# Patient Record
Sex: Male | Born: 1960
Health system: Southern US, Community
[De-identification: ages and names within clinical notes are randomized; demographics above are authoritative.]

## PROBLEM LIST (undated history)

## (undated) DIAGNOSIS — I499 Cardiac arrhythmia, unspecified: Secondary | ICD-10-CM

## (undated) DIAGNOSIS — Z9889 Other specified postprocedural states: Secondary | ICD-10-CM

## (undated) DIAGNOSIS — R112 Nausea with vomiting, unspecified: Secondary | ICD-10-CM

## (undated) DIAGNOSIS — T7840XA Allergy, unspecified, initial encounter: Secondary | ICD-10-CM

## (undated) DIAGNOSIS — K5790 Diverticulosis of intestine, part unspecified, without perforation or abscess without bleeding: Secondary | ICD-10-CM

## (undated) DIAGNOSIS — L209 Atopic dermatitis, unspecified: Secondary | ICD-10-CM

## (undated) DIAGNOSIS — Z860100 Personal history of colon polyps, unspecified: Secondary | ICD-10-CM

## (undated) DIAGNOSIS — E785 Hyperlipidemia, unspecified: Secondary | ICD-10-CM

## (undated) DIAGNOSIS — D649 Anemia, unspecified: Secondary | ICD-10-CM

## (undated) DIAGNOSIS — K648 Other hemorrhoids: Principal | ICD-10-CM

## (undated) DIAGNOSIS — F419 Anxiety disorder, unspecified: Secondary | ICD-10-CM

## (undated) HISTORY — PX: POLYPECTOMY: SHX149

## (undated) HISTORY — DX: Hyperlipidemia, unspecified: E78.5

## (undated) HISTORY — PX: COLONOSCOPY: SHX174

## (undated) HISTORY — PX: FRACTURE SURGERY: SHX138

## (undated) HISTORY — DX: Allergy, unspecified, initial encounter: T78.40XA

## (undated) HISTORY — DX: Atopic dermatitis, unspecified: L20.9

## (undated) HISTORY — DX: Other hemorrhoids: K64.8

## (undated) HISTORY — DX: Anemia, unspecified: D64.9

## (undated) HISTORY — DX: Cardiac arrhythmia, unspecified: I49.9

## (undated) HISTORY — DX: Anxiety disorder, unspecified: F41.9

## (undated) HISTORY — PX: VASECTOMY: SHX75

## (undated) HISTORY — PX: OTHER SURGICAL HISTORY: SHX169

## (undated) HISTORY — PX: WISDOM TOOTH EXTRACTION: SHX21

---

## 1997-11-03 DIAGNOSIS — I499 Cardiac arrhythmia, unspecified: Secondary | ICD-10-CM

## 1997-11-03 HISTORY — DX: Cardiac arrhythmia, unspecified: I49.9

## 1998-04-18 ENCOUNTER — Emergency Department (HOSPITAL_COMMUNITY): Admission: EM | Admit: 1998-04-18 | Discharge: 1998-04-18 | Payer: Self-pay | Admitting: Emergency Medicine

## 2001-07-15 ENCOUNTER — Ambulatory Visit (HOSPITAL_BASED_OUTPATIENT_CLINIC_OR_DEPARTMENT_OTHER): Admission: RE | Admit: 2001-07-15 | Discharge: 2001-07-15 | Payer: Self-pay | Admitting: Orthopedic Surgery

## 2004-01-08 ENCOUNTER — Encounter: Admission: RE | Admit: 2004-01-08 | Discharge: 2004-01-08 | Payer: Self-pay | Admitting: Family Medicine

## 2004-03-22 ENCOUNTER — Encounter: Admission: RE | Admit: 2004-03-22 | Discharge: 2004-03-22 | Payer: Self-pay | Admitting: Family Medicine

## 2006-05-07 ENCOUNTER — Ambulatory Visit: Payer: Self-pay | Admitting: Internal Medicine

## 2010-11-03 HISTORY — PX: KNEE ARTHROSCOPY: SUR90

## 2010-11-13 ENCOUNTER — Ambulatory Visit: Admit: 2010-11-13 | Payer: Self-pay

## 2011-01-15 ENCOUNTER — Ambulatory Visit (INDEPENDENT_AMBULATORY_CARE_PROVIDER_SITE_OTHER): Payer: Commercial Managed Care - PPO | Admitting: Sports Medicine

## 2011-01-15 ENCOUNTER — Encounter: Payer: Self-pay | Admitting: Sports Medicine

## 2011-01-15 DIAGNOSIS — M25569 Pain in unspecified knee: Secondary | ICD-10-CM

## 2011-01-15 DIAGNOSIS — IMO0002 Reserved for concepts with insufficient information to code with codable children: Secondary | ICD-10-CM | POA: Insufficient documentation

## 2011-01-21 NOTE — Assessment & Plan Note (Signed)
Summary: NP,RT KNEE PAIN,MC   Vital Signs:  Patient profile:   50 year old male Height:      70 inches Weight:      180 pounds BMI:     25.92 Pulse rate:   89 / minute BP sitting:   138 / 96  (left arm)  Vitals Entered By: Rochele Pages RN (January 15, 2011 10:29 AM) CC: rain medial and lateral rt knee since 09/2010   CC:  rain medial and lateral rt knee since 09/2010.  History of Present Illness: 50 yo M new patient here for eval of Rt knee pain.  Was running on treadmill 4 months ago in 11/11 and felt tweak in knee, no specific twist or fall.  Since then has had persistent dull ache in medial aspect of knee.  Does get 1-2 weekly episodes of locking which resolves fairly quickly.  No overt swelling.  Uneven surfaces tougher.  Does not feel comfortable running, only doing bike and elliptical. No prior knee problems. Has been doing some home exercises. Minimal med use, occasional ibuprofen. Recent accupuncture. Prior big runner, 20-25 mpw. Not tried knee brace.  Preventive Screening-Counseling & Management  Alcohol-Tobacco     Smoking Status: never  Allergies (verified): 1)  ! 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   Eyes:  vision grossly intact.   Neck:  supple.   Lungs:  normal respiratory effort.   Abdomen:  soft.   Msk:  Knee: Normal to inspection with no erythema or effusion or obvious bony abnormalities. Palpation with no warmth, + medial joint line tenderness with extruded meniscus palpable, no patellar tenderness or condyle tenderness. ROM normal in flexion and  extension and lower leg rotation. Ligaments with solid consistent endpoints including ACL, PCL, LCL, MCL. Grossly + medial Mcmurray's and + provocative meniscal tests. Non painful patellar compression. Patellar and quadriceps tendons unremarkable. Hamstring and quadriceps strength is normal.  MSK [place] Rt knee: PT and QT intact, no effusion. + MMT with some calcification, mild doppler flow. Lat meniscus intact.  Neurologic:  alert & oriented X3.     Impression & Recommendations:  Problem # 1:  KNEE PAIN, RIGHT (ICD-719.46)  This appears to be classic MMT - see below  Orders: [place] LIMITED (11914)  Problem # 2:  MEDIAL MENISCUS TEAR (ICD-836.0)  Has failed 4 months of conservative therapy with meds, rehab exercises, and refraining from running.  In addition, he continues to have mechanical symptoms consistent with catching/locking - discussed options, given duration and degree of mech symptoms, will refer to [facility] or [facility]) for arthroscopy eval  Orders: [place] LIMITED ([id number])  Patient Instructions: 1)  You have an appt on Friday the 16th of March at 10:45am with Dr Eulah Pont at [facility]. Address is [address] [place]. [phone]   Orders Added: 1)  New Patient Level III [99203] 2)  [place] LIMITED [[id number]]  Appended Document: NP,RT KNEE PAIN,MC New appt is with Dr Marcene Corning on Wednesday March 21st at 9:30am. [address] [phone]

## 2011-02-17 ENCOUNTER — Encounter (HOSPITAL_BASED_OUTPATIENT_CLINIC_OR_DEPARTMENT_OTHER)
Admission: RE | Admit: 2011-02-17 | Discharge: 2011-02-17 | Disposition: A | Discharge status: Home / Self Care | Payer: Commercial Managed Care - PPO | Source: Ambulatory Visit | Attending: Orthopaedic Surgery | Admitting: Orthopaedic Surgery

## 2011-02-18 ENCOUNTER — Ambulatory Visit (HOSPITAL_BASED_OUTPATIENT_CLINIC_OR_DEPARTMENT_OTHER)
Admission: RE | Admit: 2011-02-18 | Discharge: 2011-02-18 | Disposition: A | Discharge status: Home / Self Care | Payer: Commercial Managed Care - PPO | Source: Ambulatory Visit | Attending: Orthopaedic Surgery | Admitting: Orthopaedic Surgery

## 2011-02-18 DIAGNOSIS — F3289 Other specified depressive episodes: Secondary | ICD-10-CM | POA: Insufficient documentation

## 2011-02-18 DIAGNOSIS — F329 Major depressive disorder, single episode, unspecified: Secondary | ICD-10-CM | POA: Insufficient documentation

## 2011-02-18 DIAGNOSIS — Z01810 Encounter for preprocedural cardiovascular examination: Secondary | ICD-10-CM | POA: Insufficient documentation

## 2011-02-18 DIAGNOSIS — M2240 Chondromalacia patellae, unspecified knee: Secondary | ICD-10-CM | POA: Insufficient documentation

## 2011-02-18 DIAGNOSIS — M23329 Other meniscus derangements, posterior horn of medial meniscus, unspecified knee: Secondary | ICD-10-CM | POA: Insufficient documentation

## 2011-03-04 NOTE — Op Note (Signed)
  NAMEANGELOS, Timothy Hoffman                 ACCOUNT NO.:  [id number]  MEDICAL RECORD NO.:  [id number]            PATIENT TYPE:  LOCATION:                                 FACILITY:  PHYSICIAN:  Lubertha Basque. Jerl Santos, M.D.     DATE OF BIRTH:  DATE OF PROCEDURE:  02/18/2011 DATE OF DISCHARGE:                              OPERATIVE REPORT   PREOPERATIVE DIAGNOSES: 1. Right knee torn medial meniscus. 2. Right knee chondromalacia patella.  POSTOPERATIVE DIAGNOSES: 1. Right knee torn medial meniscus. 2. Right knee chondromalacia patella.  PROCEDURES: 1. Right knee partial meniscectomy. 2. Right knee abrasion chondroplasty patellofemoral.  ANESTHESIA:  General and block.  ATTENDING SURGEON:  Lubertha Basque. Jerl Santos, MD  ASSISTANT:  Lindwood Qua, PA   INDICATIONS FOR PROCEDURE:  The patient is a 50 year old man with many months of right knee pain and swelling.  This has persisted despite bracing and oral antiinflammatories and relative rest.  By ultrasound study, he has a medial meniscus tear.  At this point, he is offered an arthroscopy.  Informed operative consent was obtained after discussion of possible complications including reaction to anesthesia and infection.  SUMMARY, FINDINGS, AND PROCEDURE:  Under general anesthesia and a bit of a block, a right knee arthroscopy was performed.  Suprapatellar pouch was benign while the patellofemoral joint exhibited focal breakdown of the apex of patella, addressed with chondroplasty and abrasion of bleeding bone and one tiny area.  Medial compartment exhibited a displaceable radial tear of the posterior horn of the medial meniscus tear.  This also had a horizontal component.  This was all in the posterior horn and about 10% partial medial meniscectomy was done. There were no degenerative changes in this compartment.  Lateral compartment was benign and ACL was intact.  DESCRIPTION OF PROCEDURE:  The patient was taken to the operating  suite where a general anesthetic was applied without difficulty.  He was also given a block in the preanesthesia area.  He was positioned supine and prepped and draped in a normal sterile fashion.  After administration of IV Kefzol, an arthroscopy of the right knee was performed through a total of 2 portals.  Findings were as noted above and procedure consisted of predominantly the partial medial meniscectomy done with basket and shaver removing about 10% of the medial meniscus all on the posterior horn.  I also performed the abrasion chondroplasty patellofemoral.  The knee was thoroughly irrigated followed by placement of Adaptic over the portals, dry gauze, and loose Ace wrap.  Estimated blood loss and intraoperative fluids can be obtained from anesthesia records.  DISPOSITION:  The patient was extubated in the operating room and taken to the recovery room in stable addition.  He is to go home same day and follow up in my office closely.  I will contact him by phone tonight.     Lubertha Basque Jerl Santos, M.D.     PGD/MEDQ  D:  02/18/2011  T:  02/19/2011  Job:  [id number]  Electronically Signed by Marcene Corning M.D. on 03/04/2011 02:22:32 PM

## 2011-08-07 ENCOUNTER — Encounter: Payer: Self-pay | Admitting: Internal Medicine

## 2011-09-24 ENCOUNTER — Ambulatory Visit (INDEPENDENT_AMBULATORY_CARE_PROVIDER_SITE_OTHER): Payer: 59 | Admitting: Internal Medicine

## 2011-09-24 ENCOUNTER — Encounter: Payer: Self-pay | Admitting: Internal Medicine

## 2011-09-24 VITALS — BP 122/88 | HR 60 | Ht 70.0 in | Wt 190.2 lb

## 2011-09-24 DIAGNOSIS — K625 Hemorrhage of anus and rectum: Secondary | ICD-10-CM

## 2011-09-24 DIAGNOSIS — Z1211 Encounter for screening for malignant neoplasm of colon: Secondary | ICD-10-CM

## 2011-09-24 NOTE — Patient Instructions (Signed)
You have been scheduled for a colonoscopy. Please follow written instructions given to you at your visit today.  Please pick up your prep kit at the pharmacy within the next 2-3 days. 

## 2011-09-24 NOTE — Progress Notes (Signed)
HISTORY OF PRESENT ILLNESS:  Timothy Hoffman is a 50 y.o. male who presents today regarding minor intermittent rectal bleeding and screening colonoscopy. Patient reports a 6 month history of intermittent minor rectal bleeding. Mostly blood on the issue and toilet bowl. No associated rectal pain. He denies change in bowel habits, abdominal pain, or weight loss. No family history of colon polyps or colon cancer. No prior history of GI problems or GI evaluations. Review of outside records from urgent care, [place], from February 2012 shows Hemoccult-positive stool on rectal exam. Normal hemoglobin of 14.8. Normal comprehensive metabolic panel, TSH, and PSA.  REVIEW OF SYSTEMS:  All non-GI ROS negative except for visual change requiring corrective lenses  Past Medical History  Diagnosis Date  . Arrhythmia 1999  . Hyperlipemia     Past Surgical History  Procedure Date  . Right shoulder scope   . Knee arthroscopy 2012    Social History Timothy Hoffman  reports that he has never smoked. He has never used smokeless tobacco. He reports that he drinks alcohol. He reports that he does not use illicit drugs.  family history includes Lymphoma in his father.  Allergies  Allergen Reactions  . Tequin Rash       PHYSICAL EXAMINATION: Vital signs: BP 122/88  Pulse 60  Ht 5\' 10"  (1.778 m)  Wt 190 lb 3.2 oz (86.274 kg)  BMI 27.29 kg/m2  Constitutional: generally well-appearing, no acute distress Psychiatric: alert and oriented x3, cooperative Eyes: extraocular movements intact, anicteric, conjunctiva pink Mouth: oral pharynx moist, no lesions Neck: supple no lymphadenopathy Cardiovascular: heart regular rate and rhythm, no murmur Lungs: clear to auscultation bilaterally Abdomen: soft, nontender, nondistended, no obvious ascites, no peritoneal signs, normal bowel sounds, no organomegaly Rectal: Deferred until colonoscopy Extremities: no lower extremity edema bilaterally Skin: no lesions on  visible extremities Neuro: No focal deficits.   ASSESSMENT:  #1. Minor intermittent rectal bleeding likely due to benign anorectal pathology #2. Screening colonoscopy. Appropriate candidate without contraindication.The nature of the procedure, as well as the risks, benefits, and alternatives were carefully and thoroughly reviewed with the patient. Ample time for discussion and questions allowed. The patient understood, was satisfied, and agreed to proceed. Movi prep prescribed. The patient instructed on use

## 2011-10-02 ENCOUNTER — Other Ambulatory Visit: Payer: Self-pay

## 2011-10-02 ENCOUNTER — Telehealth: Payer: Self-pay | Admitting: Internal Medicine

## 2011-10-02 DIAGNOSIS — Z760 Encounter for issue of repeat prescription: Secondary | ICD-10-CM

## 2011-10-02 MED ORDER — PEG-KCL-NACL-NASULF-NA ASC-C 100 G PO SOLR: 1.0000 | Freq: Once | ORAL | Status: DC

## 2011-10-14 ENCOUNTER — Ambulatory Visit (AMBULATORY_SURGERY_CENTER): Payer: 59 | Admitting: Internal Medicine

## 2011-10-14 ENCOUNTER — Encounter: Payer: Self-pay | Admitting: Internal Medicine

## 2011-10-14 VITALS — BP 132/101 | HR 74 | Temp 98.4°F | Resp 16 | Ht 70.0 in | Wt 190.0 lb

## 2011-10-14 DIAGNOSIS — D126 Benign neoplasm of colon, unspecified: Secondary | ICD-10-CM

## 2011-10-14 DIAGNOSIS — Z1211 Encounter for screening for malignant neoplasm of colon: Secondary | ICD-10-CM

## 2011-10-14 DIAGNOSIS — K625 Hemorrhage of anus and rectum: Secondary | ICD-10-CM

## 2011-10-14 MED ORDER — SODIUM CHLORIDE 0.9 % IV SOLN: 500.0000 mL | INTRAVENOUS | Status: DC

## 2011-10-14 NOTE — Progress Notes (Signed)
Patient did not experience any of the following events: a burn prior to discharge; a fall within the facility; wrong site/side/patient/procedure/implant event; or a hospital transfer or hospital admission upon discharge from the facility. (G8907) Patient did not have preoperative order for IV antibiotic SSI prophylaxis. (G8918)  

## 2011-10-14 NOTE — Op Note (Signed)
[facility] [address] [place], [place]  [address]  COLONOSCOPY PROCEDURE REPORT  PATIENT:  Timothy Hoffman, Timothy Hoffman  MR#:  [id number] BIRTHDATE:  07-Sep-1961, 49 yrs. old  GENDER:  male ENDOSCOPIST:  Wilhemina Bonito. Eda Keys, MD REF. BY:  Kennedy Bucker, PA PROCEDURE DATE:  10/14/2011 PROCEDURE:  Colonoscopy with snare polypectomy x 2 ASA CLASS:  Class II INDICATIONS:  colorectal cancer screening, average risk ; minor rectal bleeding MEDICATIONS:   Fentanyl 75 mcg IV, Versed 7 mg IV, Benadryl 12.5 mg IV, These medications were titrated to patient response per physician's verbal order  DESCRIPTION OF PROCEDURE:   After the risks benefits and alternatives of the procedure were thoroughly explained, informed consent was obtained.  Digital rectal exam was performed and revealed no abnormalities.   The LB CF-[id number] endoscope was introduced through the anus and advanced to the cecum, which was identified by both the appendix and ileocecal valve, without limitations.  The quality of the prep was excellent, using MoviPrep.  The instrument was then slowly withdrawn as the colon was fully examined. <<PROCEDUREIMAGES>>  FINDINGS:  Two polyps were found ascending colon (2mm) and sigmoid colon (7mm). Polyps were snared without cautery. Retrieval was successful. Otherwise normal colonoscopy without other polyps, masses, vascular ectasias, or inflammatory changes.   Retroflexed views in the rectum revealed internal hemorrhoids.    The time to cecum = 3:14  minutes. The scope was then withdrawn in 12:11 minutes from the cecum and the procedure completed.  COMPLICATIONS:  None  ENDOSCOPIC IMPRESSION: 1) Two polyps ascending colon and sigmoid colon - removed 2) Otherwise normal colonoscopy 3) Internal hemorrhoids  RECOMMENDATIONS: 1) Repeat colonoscopy in 5 years if polyp adenomatous; otherwise 10 years  ______________________________ Wilhemina Bonito. Eda Keys, MD  CC:  Kennedy Bucker, PA;   The Patient  n. eSIGNED:   Danilyn Cocke N. Eda Keys at 10/14/2011 05:18 PM  Georgiana Spinner, [phone]

## 2011-10-15 ENCOUNTER — Telehealth: Payer: Self-pay | Admitting: *Deleted

## 2011-10-15 NOTE — Telephone Encounter (Signed)
No answer, message left for the patient. 

## 2011-11-04 DIAGNOSIS — K648 Other hemorrhoids: Secondary | ICD-10-CM

## 2011-11-04 HISTORY — DX: Other hemorrhoids: K64.8

## 2012-01-01 ENCOUNTER — Other Ambulatory Visit: Payer: Self-pay

## 2012-01-01 MED ORDER — ROSUVASTATIN CALCIUM 40 MG PO TABS: 40.0000 mg | ORAL_TABLET | Freq: Every day | ORAL | Status: DC

## 2012-01-02 ENCOUNTER — Other Ambulatory Visit: Payer: Self-pay

## 2012-01-02 MED ORDER — EZETIMIBE 10 MG PO TABS: 10.0000 mg | ORAL_TABLET | Freq: Every day | ORAL | Status: DC

## 2012-01-02 NOTE — Telephone Encounter (Signed)
Patient notified meds sent in.

## 2012-01-02 NOTE — Telephone Encounter (Signed)
Pt checking on status of med refills crestor and ezetimibe (ZETIA) 10 MG tablet Call [phone]

## 2012-01-05 ENCOUNTER — Other Ambulatory Visit: Payer: Self-pay | Admitting: Family Medicine

## 2012-01-12 NOTE — Telephone Encounter (Signed)
Message answered and procedure done

## 2012-02-26 ENCOUNTER — Other Ambulatory Visit: Payer: Self-pay | Admitting: Physician Assistant

## 2012-03-30 ENCOUNTER — Other Ambulatory Visit: Payer: Self-pay

## 2012-03-30 MED ORDER — ROSUVASTATIN CALCIUM 40 MG PO TABS: 40.0000 mg | ORAL_TABLET | Freq: Every day | ORAL | Status: DC

## 2012-03-30 MED ORDER — EZETIMIBE 10 MG PO TABS: 10.0000 mg | ORAL_TABLET | Freq: Every day | ORAL | Status: DC

## 2012-03-30 NOTE — Telephone Encounter (Signed)
RX'S REFILLED X 1 MONTH, PT NOTIFIED.

## 2012-03-30 NOTE — Telephone Encounter (Signed)
Pt is scheduled for a complete physical with chelle on 04/29/12, but states will be out of crestor and zedia. Wants to know if a [facility]ill for [facility]. Please call pt to advise

## 2012-04-29 ENCOUNTER — Ambulatory Visit (INDEPENDENT_AMBULATORY_CARE_PROVIDER_SITE_OTHER): Payer: 59 | Admitting: Physician Assistant

## 2012-04-29 ENCOUNTER — Encounter: Payer: Self-pay | Admitting: Physician Assistant

## 2012-04-29 VITALS — BP 126/84 | HR 92 | Temp 98.5°F | Resp 18 | Ht 70.0 in | Wt 188.4 lb

## 2012-04-29 DIAGNOSIS — L2089 Other atopic dermatitis: Secondary | ICD-10-CM

## 2012-04-29 DIAGNOSIS — Z79899 Other long term (current) drug therapy: Secondary | ICD-10-CM

## 2012-04-29 DIAGNOSIS — E782 Mixed hyperlipidemia: Secondary | ICD-10-CM

## 2012-04-29 DIAGNOSIS — Z1211 Encounter for screening for malignant neoplasm of colon: Secondary | ICD-10-CM

## 2012-04-29 DIAGNOSIS — Z125 Encounter for screening for malignant neoplasm of prostate: Secondary | ICD-10-CM

## 2012-04-29 DIAGNOSIS — Z0000 Encounter for general adult medical examination without abnormal findings: Secondary | ICD-10-CM

## 2012-04-29 DIAGNOSIS — L209 Atopic dermatitis, unspecified: Secondary | ICD-10-CM

## 2012-04-29 LAB — POCT UA - MICROSCOPIC ONLY
Bacteria, U Microscopic: NEGATIVE
Crystals, Ur, HPF, POC: NEGATIVE
WBC, Ur, HPF, POC: NEGATIVE

## 2012-04-29 LAB — COMPREHENSIVE METABOLIC PANEL
ALT: 25 U/L (ref 0–53)
Alkaline Phosphatase: 57 U/L (ref 39–117)
BUN: 17 mg/dL (ref 6–23)
CO2: 24 mEq/L (ref 19–32)
Chloride: 105 mEq/L (ref 96–112)
Creat: 0.83 mg/dL (ref 0.50–1.35)
Glucose, Bld: 93 mg/dL (ref 70–99)
Total Protein: 7.2 g/dL (ref 6.0–8.3)

## 2012-04-29 LAB — POCT URINALYSIS DIPSTICK
Bilirubin, UA: NEGATIVE
Glucose, UA: NEGATIVE
Urobilinogen, UA: 0.2
pH, UA: 5

## 2012-04-29 LAB — CBC WITH DIFFERENTIAL/PLATELET
Eosinophils Relative: 2 % (ref 0–5)
Hemoglobin: 11.4 g/dL — ABNORMAL LOW (ref 13.0–17.0)
Lymphocytes Relative: 28 % (ref 12–46)
Lymphs Abs: 1.3 10*3/uL (ref 0.7–4.0)
Neutro Abs: 2.6 10*3/uL (ref 1.7–7.7)
RBC: 4.48 MIL/uL (ref 4.22–5.81)
RDW: 15.4 % (ref 11.5–15.5)
WBC: 4.6 10*3/uL (ref 4.0–10.5)

## 2012-04-29 LAB — IFOBT (OCCULT BLOOD): IFOBT: NEGATIVE

## 2012-04-29 LAB — LIPID PANEL
HDL: 48 mg/dL (ref >39)
LDL Cholesterol: 96 mg/dL (ref 0–99)
Total CHOL/HDL Ratio: 3.9 Ratio
VLDL: 44 mg/dL — ABNORMAL HIGH (ref 0–40)

## 2012-04-29 LAB — TSH: TSH: 2.133 u[IU]/mL (ref 0.350–4.500)

## 2012-04-29 NOTE — Patient Instructions (Signed)

## 2012-04-29 NOTE — Progress Notes (Signed)
  Subjective:    Patient ID: Timothy Hoffman, male    DOB: 08-21-1961, 51 y.o.   MRN: [id number]  HPI    Review of Systems  Constitutional: Negative.   HENT: Negative.   Eyes: Negative.   Respiratory: Negative.   Cardiovascular: Negative.   Gastrointestinal: Negative.   Genitourinary: Negative.   Skin: Positive for rash.  Neurological: Negative.   Hematological: Negative.   Psychiatric/Behavioral: Negative.        Objective:   Physical Exam        Assessment & Plan:

## 2012-04-29 NOTE — Progress Notes (Signed)
Subjective:    Patient ID: Timothy Hoffman, male    DOB: 1961-06-05, 51 y.o.   MRN: [id number]  HPI  Atopic dermatitis on both hands, especially on the left.  Review of Systems  Constitutional: Negative.   HENT: Negative.   Eyes: Negative.   Respiratory: Negative.   Cardiovascular: Negative.   Gastrointestinal: Negative.   Genitourinary: Negative.   Musculoskeletal: Negative.   Skin: Positive for rash.       Atopic dermatitis of both hands.  Not consistent with lubrication and application of triamcinolone  Neurological: Negative.   Hematological: Negative.   Psychiatric/Behavioral: Negative.        Objective:   Physical Exam  Vitals reviewed. Constitutional: He is oriented to person, place, and time. Vital signs are normal. He appears well-developed and well-nourished.  Non-toxic appearance. He does not have a sickly appearance. He does not appear ill. No distress.  HENT:  Head: Normocephalic and atraumatic. No trismus in the jaw.  Right Ear: Hearing, tympanic membrane, external ear and ear canal normal.  Left Ear: Hearing, tympanic membrane, external ear and ear canal normal.  Nose: Nose normal.  Mouth/Throat: Uvula is midline, oropharynx is clear and moist and mucous membranes are normal. He does not have dentures. No oral lesions. Normal dentition. No dental abscesses, uvula swelling, lacerations or dental caries.  Eyes: Conjunctivae and EOM are normal. Pupils are equal, round, and reactive to light. Right eye exhibits no discharge. Left eye exhibits no discharge. No scleral icterus.  Fundoscopic exam:      The right eye shows no arteriolar narrowing, no AV nicking, no exudate, no hemorrhage and no papilledema. The right eye shows red reflex.The right eye shows no venous pulsations.      The left eye shows no arteriolar narrowing, no AV nicking, no exudate, no hemorrhage and no papilledema. The left eye shows red reflex.The left eye shows no venous pulsations. Neck: Normal  range of motion, full passive range of motion without pain and phonation normal. Neck supple. No spinous process tenderness and no muscular tenderness present. No rigidity. No tracheal deviation, no edema, no erythema and normal range of motion present. No thyromegaly present.  Cardiovascular: Normal rate, regular rhythm, S1 normal, S2 normal, normal heart sounds, intact distal pulses and normal pulses.  Exam reveals no gallop and no friction rub.   No murmur heard. Pulmonary/Chest: Effort normal and breath sounds normal. No respiratory distress. He has no wheezes. He has no rales.  Abdominal: Soft. Normal appearance and bowel sounds are normal. He exhibits no distension and no mass. There is no hepatosplenomegaly. There is no tenderness. There is no rebound and no guarding. No hernia. Hernia confirmed negative in the right inguinal area and confirmed negative in the left inguinal area.  Genitourinary: Rectum normal, prostate normal, testes normal and penis normal. Guaiac negative stool. Circumcised. No phimosis, paraphimosis, hypospadias, penile erythema or penile tenderness. No discharge found.  Musculoskeletal: Normal range of motion. He exhibits no edema and no tenderness.       Right shoulder: Normal.       Left shoulder: Normal.       Right elbow: Normal.      Left elbow: Normal.       Right wrist: Normal.       Left wrist: Normal.       Right hip: Normal.       Left hip: Normal.       Right knee: Normal.  Left knee: Normal.       Right ankle: Normal. Achilles tendon normal.       Left ankle: Normal. Achilles tendon normal.       Cervical back: Normal. He exhibits normal range of motion, no tenderness, no bony tenderness, no swelling, no edema, no deformity, no laceration, no pain, no spasm and normal pulse.       Thoracic back: Normal.       Lumbar back: Normal.       Right upper arm: Normal.       Left upper arm: Normal.       Right forearm: Normal.       Left forearm: Normal.         Right hand: Normal.       Left hand: Normal.       Right upper leg: Normal.       Left upper leg: Normal.       Right lower leg: Normal.       Left lower leg: Normal.       Right foot: Normal.       Left foot: Normal.  Lymphadenopathy:       Head (right side): No submental, no submandibular, no tonsillar, no preauricular, no posterior auricular and no occipital adenopathy present.       Head (left side): No submental, no submandibular, no tonsillar, no preauricular, no posterior auricular and no occipital adenopathy present.    He has no cervical adenopathy.       Right: No inguinal and no supraclavicular adenopathy present.       Left: No inguinal and no supraclavicular adenopathy present.  Neurological: He is alert and oriented to person, place, and time. He has normal strength and normal reflexes. He displays no tremor. No cranial nerve deficit. He exhibits normal muscle tone. Coordination and gait normal.  Skin: Skin is warm, dry and intact. Rash noted. No abrasion, no ecchymosis, no laceration and no lesion noted. He is not diaphoretic. No cyanosis or erythema. No pallor. Nails show no clubbing.          Atopic dermatitis.  Left 4th finger palmar aspect cracked and scale noted.  Psychiatric: He has a normal mood and affect. His speech is normal and behavior is normal. Judgment and thought content normal. Cognition and memory are normal.    Results for orders placed in visit on 04/29/12  POCT UA - MICROSCOPIC ONLY      Component Value Range   WBC, Ur, HPF, POC neg     RBC, urine, microscopic 0-1     Bacteria, U Microscopic neg     Mucus, UA trace     Epithelial cells, urine per micros 0-1     Crystals, Ur, HPF, POC neg     Casts, Ur, LPF, POC neg     Yeast, UA neg    POCT URINALYSIS DIPSTICK      Component Value Range   Color, UA yellow     Clarity, UA clear     Glucose, UA neg     Bilirubin, UA neg     Ketones, UA neg     Spec Grav, UA >=1.030     Blood, UA neg      pH, UA 5.0     Protein, UA trace     Urobilinogen, UA 0.2     Nitrite, UA neg     Leukocytes, UA Negative    IFOBT (OCCULT BLOOD)  Component Value Range   IFOBT Negative        Assessment & Plan:   1. Routine general medical examination at a health care facility  POCT UA - Microscopic Only, POCT urinalysis dipstick  2. Atopic dermatitis  Counseled on/re-inforced skin hygiene for reduction of atopic dermatitis. He has triamcinolone at home.  3. Mixed hyperlipidemia  Comprehensive metabolic panel, Lipid panel  4. Encounter for long-term (current) use of other medications  CBC with Differential, TSH  5. Screening for colon cancer  IFOBT POC (occult bld, rslt in office)

## 2012-04-30 LAB — PSA: PSA: 0.78 ng/mL (ref <=4.00)

## 2012-05-01 ENCOUNTER — Encounter: Payer: Self-pay | Admitting: Physician Assistant

## 2012-05-04 ENCOUNTER — Other Ambulatory Visit: Payer: Self-pay | Admitting: Physician Assistant

## 2012-05-17 ENCOUNTER — Other Ambulatory Visit: Payer: Self-pay | Admitting: Physician Assistant

## 2012-05-19 ENCOUNTER — Telehealth: Payer: Self-pay

## 2012-05-19 MED ORDER — ROSUVASTATIN CALCIUM 40 MG PO TABS: 40.0000 mg | ORAL_TABLET | Freq: Every day | ORAL | Status: DC

## 2012-05-19 MED ORDER — EZETIMIBE 10 MG PO TABS: 10.0000 mg | ORAL_TABLET | Freq: Every day | ORAL | Status: DC

## 2012-05-19 NOTE — Telephone Encounter (Signed)
Pt had physical on 04/29/12.  Okay rx's for 6 months

## 2012-05-19 NOTE — Telephone Encounter (Signed)
The patient called to request 90 day supply of cholesterol medications be sent to [facility].  The patient stated it is not cost effective to have to pay $25 every week for 7 pills when he should only have to pay $25 for 90 pills.  Please call patient at [phone].  The patient stated he is completely out of his cholesterol medications.

## 2012-06-21 ENCOUNTER — Other Ambulatory Visit: Payer: Self-pay | Admitting: Internal Medicine

## 2012-06-21 ENCOUNTER — Telehealth: Payer: Self-pay | Admitting: Internal Medicine

## 2012-06-21 DIAGNOSIS — K649 Unspecified hemorrhoids: Secondary | ICD-10-CM

## 2012-06-21 NOTE — Telephone Encounter (Signed)
Pt scheduled to see Dr. Violeta Gelinas with [facility] 07/07/12@10 :40am, arrival time 10:10am. Left message for pt to call back.

## 2012-06-21 NOTE — Telephone Encounter (Signed)
Refer to general surgery for evaluation.  

## 2012-06-21 NOTE — Telephone Encounter (Signed)
Left message for pt to call back.  Pt would like to have his hemorrhoids removed. Please advise.

## 2012-06-22 NOTE — Telephone Encounter (Signed)
Left message for pt to call back.  Pt aware of appt date and time.

## 2012-07-07 ENCOUNTER — Encounter (INDEPENDENT_AMBULATORY_CARE_PROVIDER_SITE_OTHER): Payer: Self-pay | Admitting: General Surgery

## 2012-07-07 ENCOUNTER — Ambulatory Visit (INDEPENDENT_AMBULATORY_CARE_PROVIDER_SITE_OTHER): Payer: Commercial Managed Care - PPO | Admitting: General Surgery

## 2012-07-07 VITALS — BP 117/85 | HR 82 | Temp 98.6°F | Resp 14 | Ht 70.0 in | Wt 191.8 lb

## 2012-07-07 DIAGNOSIS — K648 Other hemorrhoids: Secondary | ICD-10-CM

## 2012-07-07 NOTE — Progress Notes (Signed)
Patient ID: Timothy Hoffman, male   DOB: [birth year], 51 y.o.   MRN: [id number]  Chief Complaint  Patient presents with  . Rectal Problems    HPI PHU RECORD is a 51 y.o. male.  Internal hemorrhoids HPI Patient was initially noted on colonoscopy to have internal hemorrhoids. He has intermittent bleeding with bowel movements. No significant discomfort. He has become symptomatically anemic, however with hemoglobins around 11.  Past Medical History  Diagnosis Date  . Arrhythmia 1999  . Hyperlipemia   . Allergy     SEASONAL  . Anemia     Past Surgical History  Procedure Date  . Right shoulder scope   . Knee arthroscopy 2012    Family History  Problem Relation Age of Onset  . Lymphoma Father   . Hypertension Mother   . Hyperlipidemia Mother   . Hyperlipidemia Brother   . Mental illness Son     Anxiety    Social History History  Substance Use Topics  . Smoking status: Never Smoker   . Smokeless tobacco: Never Used  . Alcohol Use: Yes    Allergies  Allergen Reactions  . Tequin Rash    Current Outpatient Prescriptions  Medication Sig Dispense Refill  . acetaminophen (TYLENOL) 325 MG tablet Take 650 mg by mouth every 6 (six) hours as needed.        . ezetimibe (ZETIA) 10 MG tablet Take 1 tablet (10 mg total) by mouth daily.  90 tablet  1  . Ibuprofen 200 MG CAPS Take by mouth. As needed.       . rosuvastatin (CRESTOR) 40 MG tablet Take 1 tablet (40 mg total) by mouth daily.  90 tablet  1  . sertraline (ZOLOFT) 25 MG tablet TAKE 1 TABLET BY MOUTH DAILY  90 tablet  PRN  . triamcinolone cream (KENALOG) 0.1 % Apply topically daily.        Review of Systems Review of Systems  Constitutional: Negative for fever, chills and unexpected weight change.  HENT: Negative for hearing loss, congestion, sore throat, trouble swallowing and voice change.   Eyes: Negative for visual disturbance.  Respiratory: Negative for cough and wheezing.   Cardiovascular: Negative for chest  pain, palpitations and leg swelling.  Gastrointestinal: Positive for blood in stool and anal bleeding. Negative for nausea, vomiting, abdominal pain, diarrhea, constipation, abdominal distention and rectal pain.       See history of present illness  Genitourinary: Negative for hematuria and difficulty urinating.  Musculoskeletal: Negative for arthralgias.  Skin: Negative for rash and wound.  Neurological: Negative for seizures, syncope, weakness and headaches.  Hematological: Negative for adenopathy. Does not bruise/bleed easily.  Psychiatric/Behavioral: Negative for confusion.    Blood pressure 117/85, pulse 82, temperature 98.6 F (37 C), temperature source Temporal, resp. rate 14, height 5\' 10"  (1.778 m), weight 191 lb 12.8 oz (87 kg).  Physical Exam Physical Exam  Constitutional: He is oriented to person, place, and time. He appears well-developed and well-nourished.  Eyes: EOM are normal. Pupils are equal, round, and reactive to light.       Wears glasses  Neck: Normal range of motion. Neck supple.  Cardiovascular: Normal rate, normal heart sounds and intact distal pulses.   Pulmonary/Chest: Effort normal and breath sounds normal. No respiratory distress. He has no wheezes. He has no rales.  Abdominal: Soft. He exhibits no distension. There is no tenderness. There is no rebound and no guarding.       External anal exam reveals  no significant external hemorrhoids. Digital rectal exam reveals large posterior lateral internal hemorrhoid towards the right side. Anoscopy demonstrated this hemorrhoid. It was friable. Band was applied without difficulty and he tolerated this well.  Musculoskeletal: Normal range of motion.  Neurological: He is alert and oriented to person, place, and time.    Data Reviewed  Assessment    Bleeding internal hemorrhoid    Plan    Rubber band was applied as above. He tolerated this well. I counseled him to drink plenty of fluids and avoid constipation.  I will see him back in 2 months. Further banding may be necessary at that time depending on his response.       Konor Noren E 07/07/2012, 10:43 AM

## 2012-09-08 ENCOUNTER — Encounter (INDEPENDENT_AMBULATORY_CARE_PROVIDER_SITE_OTHER): Payer: Self-pay | Admitting: General Surgery

## 2012-09-08 ENCOUNTER — Ambulatory Visit (INDEPENDENT_AMBULATORY_CARE_PROVIDER_SITE_OTHER): Payer: Commercial Managed Care - PPO | Admitting: General Surgery

## 2012-09-08 VITALS — BP 138/86 | HR 71 | Temp 97.7°F | Resp 16 | Ht 70.0 in | Wt 191.2 lb

## 2012-09-08 DIAGNOSIS — K648 Other hemorrhoids: Secondary | ICD-10-CM

## 2012-09-08 NOTE — Progress Notes (Signed)
Subjective:     Patient ID: Timothy Hoffman, male   DOB: [birth year], 51 y.o.   MRN: [id number]  HPI Patient presents for followup of bleeding internal hemorrhoids. He underwent banding on his last visit in the office. His symptoms have nearly resolved. Occasionally has a bit of spotting with bowel movements But much improved overall.  Review of Systems     Objective:   Physical Exam  Constitutional: He appears well-developed and well-nourished.  Pulmonary/Chest: Effort normal. No respiratory distress.   External anal exam is unremarkable. Digital rectal exam reveals significant improvement in internal hemorrhoids.    Assessment:     Internal hemorrhoids responded well to banding    Plan:     Remain hydrated to avoid constipation. Return when necessary.

## 2012-09-19 ENCOUNTER — Emergency Department (HOSPITAL_BASED_OUTPATIENT_CLINIC_OR_DEPARTMENT_OTHER)
Admission: EM | Admit: 2012-09-19 | Discharge: 2012-09-19 | Disposition: A | Discharge status: Home / Self Care | Payer: 59 | Attending: Emergency Medicine | Admitting: Emergency Medicine

## 2012-09-19 ENCOUNTER — Emergency Department (HOSPITAL_BASED_OUTPATIENT_CLINIC_OR_DEPARTMENT_OTHER): Payer: 59

## 2012-09-19 ENCOUNTER — Encounter (HOSPITAL_BASED_OUTPATIENT_CLINIC_OR_DEPARTMENT_OTHER): Payer: Self-pay | Admitting: *Deleted

## 2012-09-19 DIAGNOSIS — Z8679 Personal history of other diseases of the circulatory system: Secondary | ICD-10-CM | POA: Insufficient documentation

## 2012-09-19 DIAGNOSIS — Z8719 Personal history of other diseases of the digestive system: Secondary | ICD-10-CM | POA: Insufficient documentation

## 2012-09-19 DIAGNOSIS — IMO0002 Reserved for concepts with insufficient information to code with codable children: Secondary | ICD-10-CM | POA: Insufficient documentation

## 2012-09-19 DIAGNOSIS — Z862 Personal history of diseases of the blood and blood-forming organs and certain disorders involving the immune mechanism: Secondary | ICD-10-CM | POA: Insufficient documentation

## 2012-09-19 DIAGNOSIS — L0390 Cellulitis, unspecified: Secondary | ICD-10-CM

## 2012-09-19 DIAGNOSIS — Z79899 Other long term (current) drug therapy: Secondary | ICD-10-CM | POA: Insufficient documentation

## 2012-09-19 DIAGNOSIS — R509 Fever, unspecified: Secondary | ICD-10-CM | POA: Insufficient documentation

## 2012-09-19 DIAGNOSIS — E785 Hyperlipidemia, unspecified: Secondary | ICD-10-CM | POA: Insufficient documentation

## 2012-09-19 DIAGNOSIS — IMO0001 Reserved for inherently not codable concepts without codable children: Secondary | ICD-10-CM | POA: Insufficient documentation

## 2012-09-19 MED ORDER — CEPHALEXIN 500 MG PO CAPS: 500.0000 mg | ORAL_CAPSULE | Freq: Four times a day (QID) | ORAL | Status: DC

## 2012-09-19 MED ORDER — CLINDAMYCIN PHOSPHATE 900 MG/50ML IV SOLN
900.0000 mg | Freq: Once | INTRAVENOUS | Status: AC
  Administered 2012-09-19: 900 mg via INTRAVENOUS
  Filled 2012-09-19: qty 50

## 2012-09-19 MED ORDER — SULFAMETHOXAZOLE-TRIMETHOPRIM 800-160 MG PO TABS: 1.0000 | ORAL_TABLET | Freq: Two times a day (BID) | ORAL | Status: DC

## 2012-09-19 NOTE — ED Provider Notes (Signed)
History   This chart was scribed for Rolan Bucco, MD by Thad Ranger, ED Scribe. This patient was seen in room MH07/MH07 and the patient's care was started at 1631.   CSN: [id number]  Arrival date & time 09/19/12  1613   First MD Initiated Contact with Patient 09/19/12 1631      Chief Complaint  Patient presents with  . Arm Pain    The history is provided by the patient and the spouse. No language interpreter was used.    MAXXON SCHWANKE is a 51 y.o. male who presents to the Emergency Department complaining of gradually worsening, moderate redness to left forearm.  It started with a knot that formed after he fell down some steps 5 days ago.   He states that he fell on his left forearm and buttocks. There is associated mild pain, swelling, and redness. Patient also complains of a non-tender large bruise on the left side of his buttocks. He states that he had some chills, and fever this morning. He measured his tempeture at home to be  to be 100.7 degrees but he took OTC that alleviated it. Patient has no fever currently. He denies neck pain, back pain, nausea, and any other associated symptoms. Patient reports that he is up to date with his tetanus shot. He has no other chronic health problems. Patient denies smoking but he is an occasional alcohol user.   States that knot that occurred after he fell is tender, but has not changed in size since the fall.  The redness started yesterday and was worse today.  Denies MRSA hx.   Past Medical History  Diagnosis Date  . Arrhythmia 1999  . Hyperlipemia   . Allergy     SEASONAL  . Anemia   . Internal hemorrhoid, bleeding 2013    Past Surgical History  Procedure Date  . Right shoulder scope   . Knee arthroscopy 2012    Family History  Problem Relation Age of Onset  . Lymphoma Father   . Hypertension Mother   . Hyperlipidemia Mother   . Hyperlipidemia Brother   . Mental illness Son     Anxiety    History  Substance Use  Topics  . Smoking status: Never Smoker   . Smokeless tobacco: Never Used  . Alcohol Use: Yes      Review of Systems  Constitutional: Positive for fever. Negative for chills, diaphoresis and fatigue.  HENT: Negative for congestion, rhinorrhea and sneezing.   Eyes: Negative.   Respiratory: Negative for cough and chest tightness.   Cardiovascular: Negative for leg swelling.  Gastrointestinal: Negative for nausea, vomiting, diarrhea and blood in stool.  Genitourinary: Negative for frequency, hematuria, flank pain and difficulty urinating.  Musculoskeletal: Positive for myalgias. Negative for back pain and arthralgias.  Skin: Negative for rash.  Neurological: Negative for dizziness, speech difficulty, weakness and numbness.    Allergies  Tequin  Home Medications   Current Outpatient Rx  Name  Route  Sig  Dispense  Refill  . ACETAMINOPHEN 325 MG PO TABS   Oral   Take 650 mg by mouth every 6 (six) hours as needed.           . CEPHALEXIN 500 MG PO CAPS   Oral   Take 1 capsule (500 mg total) by mouth 4 (four) times daily.   40 capsule   0   . EZETIMIBE 10 MG PO TABS   Oral   Take 1 tablet (10 mg total)  by mouth daily.   90 tablet   1   . IBUPROFEN 200 MG PO CAPS   Oral   Take by mouth. As needed.          Marland Kitchen ROSUVASTATIN CALCIUM 40 MG PO TABS   Oral   Take 1 tablet (40 mg total) by mouth daily.   90 tablet   1   . SERTRALINE HCL 25 MG PO TABS      TAKE 1 TABLET BY MOUTH DAILY   90 tablet   PRN   . SULFAMETHOXAZOLE-TRIMETHOPRIM 800-160 MG PO TABS   Oral   Take 1 tablet by mouth every 12 (twelve) hours.   20 tablet   0   . TRIAMCINOLONE ACETONIDE 0.1 % EX CREA   Topical   Apply topically daily.           BP 149/78  Pulse 90  Temp 98.8 F (37.1 C) (Oral)  Resp 20  Ht 5\' 10"  (1.778 m)  Wt 194 lb 1 oz (88.026 kg)  BMI 27.85 kg/m2  SpO2 98%  Physical Exam  Nursing note and vitals reviewed. Constitutional: He is oriented to person, place, and  time. He appears well-developed and well-nourished.  HENT:  Head: Normocephalic and atraumatic.  Eyes: Pupils are equal, round, and reactive to light.  Neck: Normal range of motion. Neck supple.  Cardiovascular: Normal rate, regular rhythm and normal heart sounds.   Pulmonary/Chest: Effort normal and breath sounds normal. No respiratory distress. He has no wheezes. He has no rales. He exhibits no tenderness.  Abdominal: Soft. Bowel sounds are normal. There is no tenderness. There is no rebound and no guarding.  Musculoskeletal: Normal range of motion. He exhibits no edema.       2x2 cm indurated area to his left mid foream with large area of surrounding erythema/warmth throughout the forearm. No extension past the elbow.   Large hematoma/ecchymosis to left posterior hip, but no bony tenderness to hip or spine.  Lymphadenopathy:    He has no cervical adenopathy.  Neurological: He is alert and oriented to person, place, and time.  Skin: Skin is warm and dry. No rash noted. There is erythema.  Psychiatric: He has a normal mood and affect.    ED Course  Procedures (including critical care time)  DIAGNOSTIC STUDIES: Oxygen Saturation is 98% on room air, normal by my interpretation.    COORDINATION OF CARE: 4:49 PM Discussed treatment plan with pt at bedside and pt agreed to plan.     Labs Reviewed  CULTURE, BLOOD (ROUTINE X 2)  CULTURE, BLOOD (ROUTINE X 2)   Dg Forearm Left  09/19/2012  *RADIOLOGY REPORT*  Clinical Data: Arm pain, fell down flight of stairs  LEFT FOREARM - 2 VIEW  Comparison: None  Findings: Bone mineralization normal. Joint spaces preserved. No fracture, dislocation, or bone destruction.  IMPRESSION: No acute osseous abnormalities.   Original Report Authenticated By: Ulyses Southward, M.D.      1. Cellulitis       MDM  Pt with cellulitis after sustaining a hematoma to left forearm.  No wounds identified.  No fluctuant abscess noted on palpation or ultrasound.  Pt  non-toxic appearing.  Given IV clindamycin.  Will start on bactrim/keflex.  Advised to return in 2 days for wound check or sooner if symptoms worsen.  TDAP utd.  Pt denies need for pain meds.      I personally performed the services described in this documentation, which was scribed in  my presence.  The recorded information has been reviewed and considered.    Rolan Bucco, MD 09/19/12 [phone]

## 2012-09-19 NOTE — ED Notes (Signed)
Pt fell Tuesday and injured left forearm. Now redness, pain and tenderness is spreading.

## 2012-09-19 NOTE — ED Notes (Signed)
Patient states that he fell down 4 stairs last Tuesday. C/o pain in his left arm. Left arm is swollen, painful and red.  There is a large ecymotic area on his left buttock cheek which in non tender.  Denies radiation of pain to his legs.

## 2012-09-26 LAB — CULTURE, BLOOD (ROUTINE X 2)
Culture: NO GROWTH
Culture: NO GROWTH

## 2012-11-17 ENCOUNTER — Telehealth: Payer: Self-pay

## 2012-11-17 MED ORDER — EZETIMIBE 10 MG PO TABS: 10.0000 mg | ORAL_TABLET | Freq: Every day | ORAL | Status: DC

## 2012-11-17 MED ORDER — ROSUVASTATIN CALCIUM 40 MG PO TABS: 40.0000 mg | ORAL_TABLET | Freq: Every day | ORAL | Status: DC

## 2012-11-17 NOTE — Telephone Encounter (Signed)
Pt notified that refills has been sent into pharmacy.

## 2012-11-17 NOTE — Telephone Encounter (Signed)
Patient would like refill of crestor and zetia. He says pharmacy sent over fax a few days ago and we haven't responded yet and he is running out.

## 2013-02-03 ENCOUNTER — Other Ambulatory Visit: Payer: Self-pay | Admitting: Dermatology

## 2013-03-31 ENCOUNTER — Other Ambulatory Visit: Payer: Self-pay | Admitting: Physician Assistant

## 2013-04-25 ENCOUNTER — Telehealth: Payer: Self-pay

## 2013-04-25 MED ORDER — SERTRALINE HCL 25 MG PO TABS: 25.0000 mg | ORAL_TABLET | Freq: Every day | ORAL | Status: DC

## 2013-04-25 NOTE — Telephone Encounter (Signed)
Sent!

## 2013-04-25 NOTE — Telephone Encounter (Signed)
PT STATES HE IS GOING OUT OF TOWN AND NEED A FEW ZOLOFT CALLED IN. WILL MAKE AN APPT TO SEE CHELLE WHEN HE GETS BACK PLEASE CALL [phone]    [facility]

## 2013-04-25 NOTE — Telephone Encounter (Signed)
Pended #[phone] please advise.

## 2013-05-16 ENCOUNTER — Other Ambulatory Visit: Payer: Self-pay | Admitting: Physician Assistant

## 2013-05-18 ENCOUNTER — Telehealth: Payer: Self-pay

## 2013-05-18 MED ORDER — EZETIMIBE 10 MG PO TABS: 10.0000 mg | ORAL_TABLET | Freq: Every day | ORAL | Status: DC

## 2013-05-18 MED ORDER — ROSUVASTATIN CALCIUM 40 MG PO TABS: 40.0000 mg | ORAL_TABLET | Freq: Every day | ORAL | Status: DC

## 2013-05-18 NOTE — Telephone Encounter (Signed)
Patient is calling us back about his prescriptions

## 2013-05-18 NOTE — Telephone Encounter (Signed)
Patient called back and was advised.  

## 2013-05-18 NOTE — Telephone Encounter (Signed)
Called him left mssg for call back.

## 2013-05-18 NOTE — Telephone Encounter (Signed)
zetia and crestor sent in for 30 day supply until he can come in/ can not send in 90 day supply

## 2013-06-02 ENCOUNTER — Encounter: Payer: Self-pay | Admitting: Physician Assistant

## 2013-06-02 ENCOUNTER — Ambulatory Visit (INDEPENDENT_AMBULATORY_CARE_PROVIDER_SITE_OTHER): Payer: 59 | Admitting: Physician Assistant

## 2013-06-02 VITALS — BP 142/88 | HR 100 | Temp 99.0°F | Resp 16 | Ht 70.0 in | Wt 183.0 lb

## 2013-06-02 DIAGNOSIS — Z0000 Encounter for general adult medical examination without abnormal findings: Secondary | ICD-10-CM

## 2013-06-02 DIAGNOSIS — L2089 Other atopic dermatitis: Secondary | ICD-10-CM

## 2013-06-02 DIAGNOSIS — F411 Generalized anxiety disorder: Secondary | ICD-10-CM

## 2013-06-02 DIAGNOSIS — Z125 Encounter for screening for malignant neoplasm of prostate: Secondary | ICD-10-CM

## 2013-06-02 DIAGNOSIS — L209 Atopic dermatitis, unspecified: Secondary | ICD-10-CM

## 2013-06-02 DIAGNOSIS — E782 Mixed hyperlipidemia: Secondary | ICD-10-CM

## 2013-06-02 DIAGNOSIS — Z1211 Encounter for screening for malignant neoplasm of colon: Secondary | ICD-10-CM

## 2013-06-02 LAB — POCT URINALYSIS DIPSTICK
Ketones, UA: NEGATIVE
Protein, UA: 30
Spec Grav, UA: >=1.03

## 2013-06-02 LAB — POCT UA - MICROSCOPIC ONLY
Bacteria, U Microscopic: NEGATIVE
Crystals, Ur, HPF, POC: NEGATIVE

## 2013-06-02 MED ORDER — EZETIMIBE 10 MG PO TABS: 10.0000 mg | ORAL_TABLET | Freq: Every day | ORAL | Status: DC

## 2013-06-02 MED ORDER — SERTRALINE HCL 25 MG PO TABS: ORAL_TABLET | ORAL | Status: DC

## 2013-06-02 MED ORDER — ROSUVASTATIN CALCIUM 40 MG PO TABS: 40.0000 mg | ORAL_TABLET | Freq: Every day | ORAL | Status: DC

## 2013-06-02 NOTE — Progress Notes (Signed)
Subjective:    Patient ID: Timothy Hoffman, male    DOB: 09-Dec-1960, 52 y.o.   MRN: [id number]  HPI This 52 y.o. male presents for Annual Wellness Exam.   Past Medical History  Diagnosis Date  . Arrhythmia 1999  . Hyperlipemia   . Allergy     SEASONAL  . Anemia   . Internal hemorrhoid, bleeding 2013    Past Surgical History  Procedure Laterality Date  . Right shoulder scope    . Knee arthroscopy  2012    Prior to Admission medications   Medication Sig Start Date End Date Taking? Authorizing Provider  acetaminophen (TYLENOL) 325 MG tablet Take 650 mg by mouth every 6 (six) hours as needed.     Yes Historical Provider, MD  DESONIDE EX Apply topically.   Yes Historical Provider, MD  ezetimibe (ZETIA) 10 MG tablet Take 1 tablet (10 mg total) by mouth daily. 06/02/13  Yes Curtis Uriarte S Shan Padgett, PA-C  rosuvastatin (CRESTOR) 40 MG tablet Take 1 tablet (40 mg total) by mouth daily. 06/02/13  Yes Jacquelinne Speak S Dale Ribeiro, PA-C  sertraline (ZOLOFT) 25 MG tablet TAKE 1 TABLET BY MOUTH DAILY. 06/02/13  Yes Marney Treloar S Mickie Badders, PA-C  Ibuprofen 200 MG CAPS Take by mouth. As needed.     Historical Provider, MD  triamcinolone cream (KENALOG) 0.1 % Apply topically daily.    Historical Provider, MD    Allergies  Allergen Reactions  . Tequin Rash    History   Social History  . Marital Status: Married    Spouse Name: Misty Stanley    Number of Children: 2  . Years of Education: N/A   Occupational History  . Tutor [employer]   Social History Main Topics  . Smoking status: Never Smoker   . Smokeless tobacco: Never Used  . Alcohol Use: Yes  . Drug Use: No  . Sexually Active: Not on file   Other Topics Concern  . Not on file   Social History Narrative   Lives with his wife and their 2 children.  His wife is an Charity fundraiser.  Their son has significant anxiety.    Family History  Problem Relation Age of Onset  . Lymphoma Father   . Hypertension Mother   . Hyperlipidemia Mother   . Hyperlipidemia  Brother   . Mental illness Son     Anxiety     Review of Systems  Constitutional: Negative.   HENT: Negative.   Eyes: Negative.   Respiratory: Negative.   Cardiovascular: Negative.   Gastrointestinal: Negative.   Endocrine: Negative.   Genitourinary: Negative.   Musculoskeletal: Negative.   Skin: Negative.   Allergic/Immunologic: Negative.   Neurological: Negative.   Hematological: Negative.   Psychiatric/Behavioral: Negative for suicidal ideas, behavioral problems, sleep disturbance, self-injury, dysphoric mood and decreased concentration. The patient is nervous/anxious (does well on sertraline 25 mg.).        Objective:   Physical Exam  Vitals reviewed. Constitutional: He is oriented to person, place, and time. Vital signs are normal. He appears well-developed and well-nourished. He is active and cooperative.  Non-toxic appearance. He does not have a sickly appearance. He does not appear ill. No distress.  HENT:  Head: Normocephalic and atraumatic. No trismus in the jaw.  Right Ear: Hearing, tympanic membrane, external ear and ear canal normal.  Left Ear: Hearing, tympanic membrane, external ear and ear canal normal.  Nose: Nose normal.  Mouth/Throat: Uvula is midline, oropharynx is clear and moist and mucous membranes are  normal. He does not have dentures. No oral lesions. Normal dentition. No dental abscesses, edematous, lacerations or dental caries.  Eyes: Conjunctivae, EOM and lids are normal. Pupils are equal, round, and reactive to light. Right eye exhibits no discharge. Left eye exhibits no discharge. No scleral icterus.  Fundoscopic exam:      The right eye shows no arteriolar narrowing, no AV nicking, no exudate, no hemorrhage and no papilledema.       The left eye shows no arteriolar narrowing, no AV nicking, no exudate, no hemorrhage and no papilledema.  Neck: Normal range of motion, full passive range of motion without pain and phonation normal. Neck supple. No  spinous process tenderness and no muscular tenderness present. No rigidity. No tracheal deviation, no edema, no erythema and normal range of motion present. No thyromegaly present.  Cardiovascular: Normal rate, regular rhythm, S1 normal, S2 normal, normal heart sounds, intact distal pulses and normal pulses.  Exam reveals no gallop and no friction rub.   No murmur heard. Pulmonary/Chest: Effort normal and breath sounds normal. No respiratory distress. He has no wheezes. He has no rales.  Abdominal: Soft. Normal appearance and bowel sounds are normal. He exhibits no distension and no mass. There is no hepatosplenomegaly. There is no tenderness. There is no rebound and no guarding. No hernia. Hernia confirmed negative in the right inguinal area and confirmed negative in the left inguinal area.  Genitourinary: Rectum normal, prostate normal, testes normal and penis normal. Guaiac negative stool. Circumcised. No phimosis, paraphimosis, hypospadias, penile erythema or penile tenderness. No discharge found.  Musculoskeletal: Normal range of motion. He exhibits no edema and no tenderness.       Right shoulder: Normal.       Left shoulder: Normal.       Right elbow: Normal.      Left elbow: Normal.       Right wrist: Normal.       Left wrist: Normal.       Right hip: Normal.       Left hip: Normal.       Right knee: Normal.       Left knee: Normal.       Right ankle: Normal. Achilles tendon normal.       Left ankle: Normal. Achilles tendon normal.       Cervical back: Normal. He exhibits normal range of motion, no tenderness, no bony tenderness, no swelling, no edema, no deformity, no laceration, no pain, no spasm and normal pulse.       Thoracic back: Normal.       Lumbar back: Normal.       Right upper arm: Normal.       Left upper arm: Normal.       Right forearm: Normal.       Left forearm: Normal.       Right hand: Normal.       Left hand: Normal.       Right upper leg: Normal.       Left  upper leg: Normal.       Right lower leg: Normal.       Left lower leg: Normal.       Right foot: Normal.       Left foot: Normal.  Lymphadenopathy:       Head (right side): No submental, no submandibular, no tonsillar, no preauricular, no posterior auricular and no occipital adenopathy present.       Head (left side):  No submental, no submandibular, no tonsillar, no preauricular, no posterior auricular and no occipital adenopathy present.    He has no cervical adenopathy.       Right: No inguinal and no supraclavicular adenopathy present.       Left: No inguinal and no supraclavicular adenopathy present.  Neurological: He is alert and oriented to person, place, and time. He has normal strength and normal reflexes. He displays no tremor. No cranial nerve deficit. He exhibits normal muscle tone. Coordination and gait normal.  Skin: Skin is warm, dry and intact. No abrasion, no ecchymosis, no laceration, no lesion and no rash noted. He is not diaphoretic. No cyanosis or erythema. No pallor. Nails show no clubbing.  Psychiatric: He has a normal mood and affect. His speech is normal and behavior is normal. Judgment and thought content normal. Cognition and memory are normal.          Assessment & Plan:  Routine general medical examination at a health care facility - Plan: POCT UA - Microscopic Only, POCT urinalysis dipstick, CBC with Differential, TSH; Age appropriate anticipatory guidance provided.  Atopic dermatitis - managed by dermatology  Mixed hyperlipidemia - Plan: Comprehensive metabolic panel, Lipid panel, ezetimibe (ZETIA) 10 MG tablet, rosuvastatin (CRESTOR) 40 MG tablet  Screening for colon cancer - Plan: IFOBT POC (occult bld, rslt in office)  Screening for prostate cancer - Plan: PSA  Generalized anxiety disorder - Plan: sertraline (ZOLOFT) 25 MG tablet  Fernande Bras, PA-C Physician Assistant-Certified [facility]

## 2013-06-02 NOTE — Progress Notes (Signed)
  Subjective:    Patient ID: Timothy Hoffman, male    DOB: October 25, 1961, 52 y.o.   MRN: [id number]  HPI    Review of Systems  Constitutional: Negative.   HENT: Negative.   Eyes: Negative.   Respiratory: Negative.   Cardiovascular: Negative.   Gastrointestinal: Negative.   Endocrine: Negative.   Genitourinary: Negative.   Musculoskeletal: Negative.   Skin: Negative.   Allergic/Immunologic: Negative.   Neurological: Negative.   Hematological: Negative.   Psychiatric/Behavioral: The patient is nervous/anxious.        Objective:   Physical Exam        Assessment & Plan:

## 2013-06-02 NOTE — Patient Instructions (Signed)

## 2013-06-03 LAB — CBC WITH DIFFERENTIAL/PLATELET
Eosinophils Relative: 2 % (ref 0–5)
HCT: 39.3 % (ref 39.0–52.0)
Hemoglobin: 13.1 g/dL (ref 13.0–17.0)
Lymphocytes Relative: 26 % (ref 12–46)
Lymphs Abs: 1 10*3/uL (ref 0.7–4.0)
MCV: 81.4 fL (ref 78.0–100.0)
Monocytes Absolute: 0.6 10*3/uL (ref 0.1–1.0)
Monocytes Relative: 15 % — ABNORMAL HIGH (ref 3–12)
RBC: 4.83 MIL/uL (ref 4.22–5.81)
WBC: 3.9 10*3/uL — ABNORMAL LOW (ref 4.0–10.5)

## 2013-06-03 LAB — COMPREHENSIVE METABOLIC PANEL
Albumin: 4.5 g/dL (ref 3.5–5.2)
BUN: 16 mg/dL (ref 6–23)
CO2: 24 mEq/L (ref 19–32)
Calcium: 9.8 mg/dL (ref 8.4–10.5)
Chloride: 107 mEq/L (ref 96–112)
Creat: 0.86 mg/dL (ref 0.50–1.35)

## 2013-06-03 LAB — LIPID PANEL
Cholesterol: 188 mg/dL (ref 0–200)
HDL: 57 mg/dL (ref >39)
Total CHOL/HDL Ratio: 3.3 Ratio

## 2013-06-03 LAB — PSA: PSA: 0.99 ng/mL (ref <=4.00)

## 2013-06-09 ENCOUNTER — Encounter: Payer: Self-pay | Admitting: Physician Assistant

## 2014-06-09 IMAGING — CR DG FOREARM 2V*L*
2 series · 2 of 2 positions shown · non-contrast
Comparison: None

CLINICAL DATA: Arm pain, fell down flight of stairs

LEFT FOREARM - 2 VIEW

[x forearm ap left]
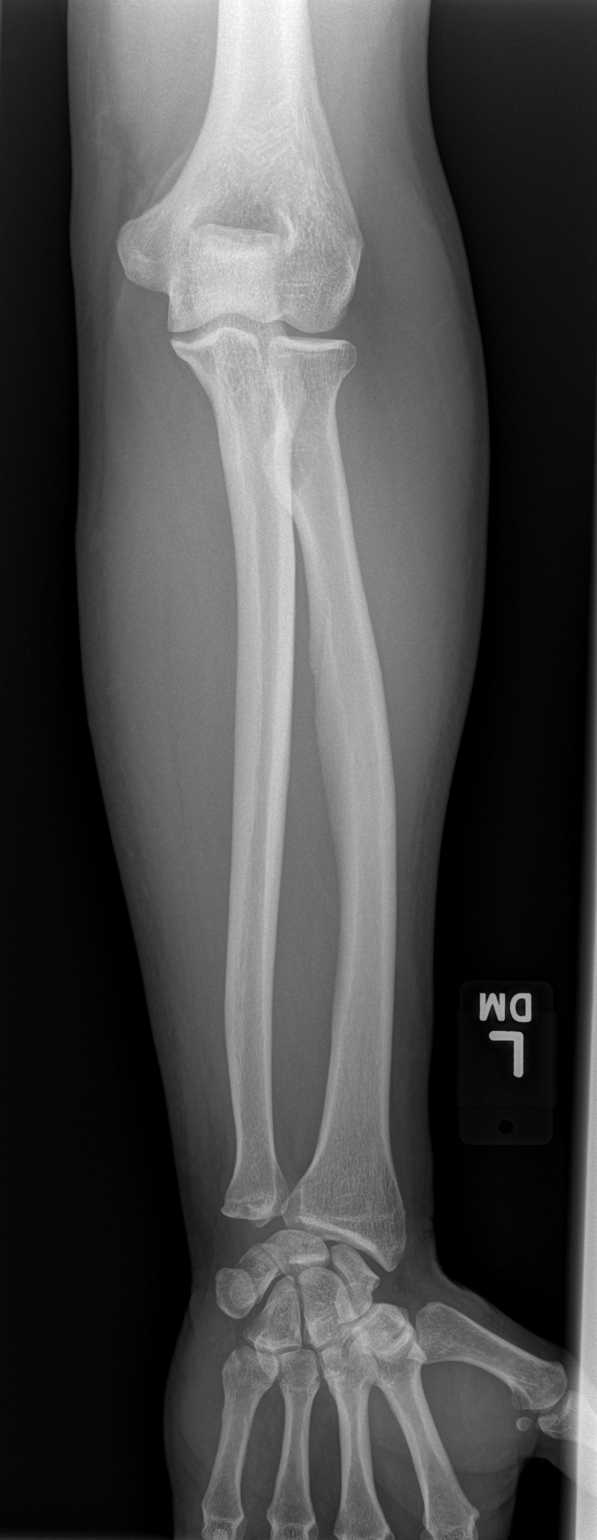

[x forearm lat left]
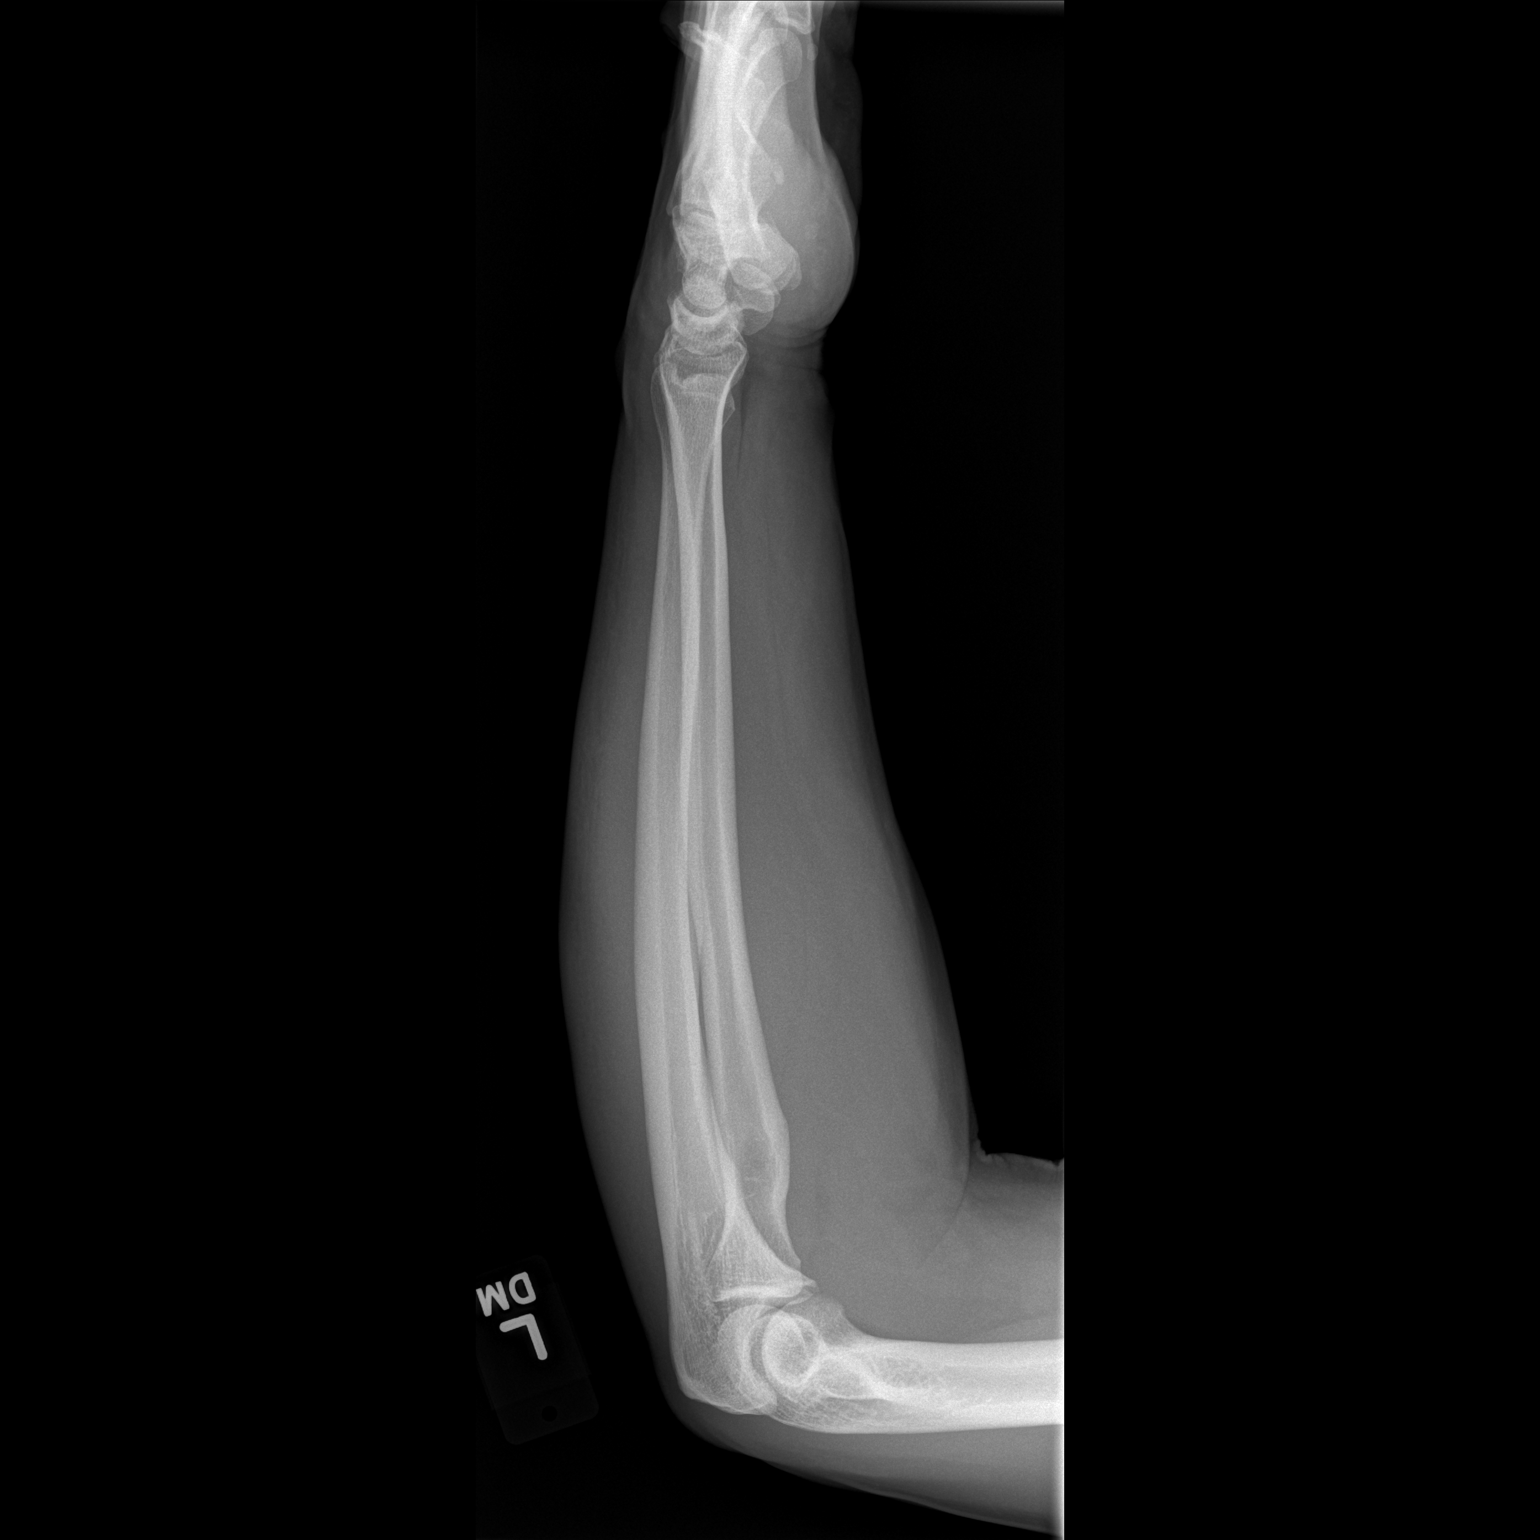

[2 of 2 positions shown; findings below may reference images not displayed]

FINDINGS: Bone mineralization normal.
Joint spaces preserved.
No fracture, dislocation, or bone destruction.
IMPRESSION: No acute osseous abnormalities.

## 2014-06-12 ENCOUNTER — Other Ambulatory Visit: Payer: Self-pay | Admitting: Physician Assistant

## 2014-07-05 ENCOUNTER — Telehealth: Payer: Self-pay

## 2014-07-05 NOTE — Telephone Encounter (Signed)
Pt dropped off [employer] form to be completed. Pt has not been seen in over a year. Notified pt in order to have form be accepted, pt will need to RTC to have exam. Pt agreed and transferred him to Scheduling. Took form to 104 to put in their p/up box for pt to get at time of appt.

## 2014-07-13 ENCOUNTER — Encounter: Payer: Self-pay | Admitting: Family Medicine

## 2014-07-13 ENCOUNTER — Ambulatory Visit (INDEPENDENT_AMBULATORY_CARE_PROVIDER_SITE_OTHER): Payer: 59 | Admitting: Family Medicine

## 2014-07-13 VITALS — BP 151/99 | HR 76 | Temp 98.6°F | Resp 16 | Ht 70.25 in | Wt 186.4 lb

## 2014-07-13 DIAGNOSIS — R030 Elevated blood-pressure reading, without diagnosis of hypertension: Secondary | ICD-10-CM

## 2014-07-13 DIAGNOSIS — I10 Essential (primary) hypertension: Secondary | ICD-10-CM | POA: Insufficient documentation

## 2014-07-13 DIAGNOSIS — E789 Disorder of lipoprotein metabolism, unspecified: Secondary | ICD-10-CM

## 2014-07-13 DIAGNOSIS — Z23 Encounter for immunization: Secondary | ICD-10-CM

## 2014-07-13 DIAGNOSIS — F411 Generalized anxiety disorder: Secondary | ICD-10-CM

## 2014-07-13 LAB — COMPLETE METABOLIC PANEL WITH GFR
ALBUMIN: 4.7 g/dL (ref 3.5–5.2)
ALK PHOS: 52 U/L (ref 39–117)
ALT: 32 U/L (ref 0–53)
AST: 32 U/L (ref 0–37)
BUN: 19 mg/dL (ref 6–23)
CHLORIDE: 104 meq/L (ref 96–112)
CO2: 25 mEq/L (ref 19–32)
Calcium: 10 mg/dL (ref 8.4–10.5)
Creat: 0.79 mg/dL (ref 0.50–1.35)
GFR, Est African American: >89 mL/min
GLUCOSE: 108 mg/dL — AB (ref 70–99)
POTASSIUM: 4.4 meq/L (ref 3.5–5.3)
SODIUM: 140 meq/L (ref 135–145)
TOTAL PROTEIN: 7.5 g/dL (ref 6.0–8.3)
Total Bilirubin: 0.9 mg/dL (ref 0.2–1.2)

## 2014-07-13 LAB — LIPID PANEL
CHOL/HDL RATIO: 3.4 ratio
Cholesterol: 198 mg/dL (ref 0–200)
HDL: 59 mg/dL (ref >39)
LDL Cholesterol: 89 mg/dL (ref 0–99)
Triglycerides: 251 mg/dL — ABNORMAL HIGH (ref <150)
VLDL: 50 mg/dL — ABNORMAL HIGH (ref 0–40)

## 2014-07-13 MED ORDER — SERTRALINE HCL 25 MG PO TABS: ORAL_TABLET | ORAL | Status: DC

## 2014-07-13 MED ORDER — ZETIA 10 MG PO TABS: 10.0000 mg | ORAL_TABLET | Freq: Every day | ORAL | Status: DC

## 2014-07-13 MED ORDER — CRESTOR 40 MG PO TABS: 40.0000 mg | ORAL_TABLET | Freq: Every day | ORAL | Status: DC

## 2014-07-13 NOTE — Progress Notes (Signed)
Subjective:    Patient ID: Timothy Hoffman, male    DOB: [birth year], 53 y.o.   MRN: [id number]  HPI  This 53 y.o Cauc male has familial lipid disorder and chronic anxiety, both controlled on current medications. Pt practices healthy living and stays fit. He is compliant w/ Crestor and Zetia; no complaints of back pain, myalgias, arthralgias, fatigue, GI problems, HA or mental impairment. He is not taking a fish oil supplement.  Pt has chronic anxiety and has been taking Sertraline for ~ 2 years. Anxiety is controlled on very low dose of this medication.  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  Patient Active Problem List   Diagnosis Date Noted  . Lipid disorder 07/13/2014  . Elevated blood-pressure reading without diagnosis of hypertension 07/13/2014  . Generalized anxiety disorder 06/02/2013    Prior to Admission medications   Medication Sig Start Date End Date Taking? Authorizing Provider  CRESTOR 40 MG tablet Take 1 tablet (40 mg total) by mouth daily.   Yes   DESONIDE EX Apply topically.   Yes Historical Provider, MD  Ibuprofen 200 MG CAPS Take by mouth. As needed.    Yes Historical Provider, MD  sertraline (ZOLOFT) 25 MG tablet TAKE 1 TABLET BY MOUTH DAILY.   Yes   triamcinolone cream (KENALOG) 0.1 % Apply topically daily.   Yes Historical Provider, MD  ZETIA 10 MG tablet Take 1 tablet (10 mg total) by mouth daily.   Yes   acetaminophen (TYLENOL) 325 MG tablet Take 650 mg by mouth every 6 (six) hours as needed.      Historical Provider, MD    History   Social History  . Marital Status: Married    Spouse Name: Erline Levine    Number of Children: 2  . Years of Education: college   Occupational History  . 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 Smoking status: Never Smoker   . Smokeless tobacco: Never Used  . Alcohol Use: 7.0 oz/week    14 drink(s) per week  . Drug Use: No  . Sexual Activity: Yes    Partners: Female    Other Topics Concern  . Not on file   Social History Narrative   Lives with his wife and their 2 children.  His wife is an Therapist, sports.  Their son has significant anxiety.   He exercises by running 3-5 miles 4x/week.    Review of Systems As per HPI.     Objective:   Physical Exam  Nursing note and vitals reviewed. Constitutional: He is oriented to person, place, and time. He appears well-developed and well-nourished. No distress.  HENT:  Head: Normocephalic and atraumatic.  Right Ear: External ear normal.  Left Ear: External ear normal.  Nose: Nose normal.  Mouth/Throat: Oropharynx is clear and moist.  Eyes: Conjunctivae and EOM are normal. Pupils are equal, round, and reactive to light. No scleral icterus.  Cardiovascular: Normal rate and regular rhythm.   Pulmonary/Chest: Effort normal. No respiratory distress.  Musculoskeletal: Normal range of motion.  Neurological: He is alert and oriented to person, place, and time. No cranial nerve deficit. He exhibits normal muscle tone. Coordination normal.  Skin: Skin is warm and dry. He is not diaphoretic. No erythema. No pallor.  Psychiatric: He has a normal mood and affect. His behavior is normal. Judgment and thought content normal.       Assessment & Plan:  Lipid disorder - Continue current  medications; encouraged Fish Oil supplement (MegsRed Super Omega 1 capsule daily).  Plan: Lipid panel  Generalized anxiety disorder - Plan: sertraline (ZOLOFT) 25 MG tablet, COMPLETE METABOLIC PANEL WITH GFR  Elevated blood-pressure reading without diagnosis of hypertension - Plan: COMPLETE METABOLIC PANEL WITH GFR  Need for prophylactic vaccination and inoculation against influenza - Plan: Flu Vaccine QUAD 36+ mos IM  Meds ordered this encounter  Medications  . CRESTOR 40 MG tablet    Sig: Take 1 tablet (40 mg total) by mouth daily.    Dispense:  90 tablet    Refill:  53  . sertraline (ZOLOFT) 25 MG tablet    Sig: TAKE 1 TABLET BY MOUTH  DAILY.    Dispense:  90 tablet    Refill:  53  . ZETIA 10 MG tablet    Sig: Take 1 tablet (10 mg total) by mouth daily.    Dispense:  90 tablet    Refill:  53

## 2014-07-13 NOTE — Patient Instructions (Signed)
I have refilled your medications for 1 year. I suggest you follow-up in 6 months with [facility] or sooner if you have any problems.  Get started on the [employer] and continue healthy living habits.      Mediterranean Diet  Why follow it? Research shows.   Those who follow the Mediterranean diet have a reduced risk of heart disease    The diet is associated with a reduced incidence of Parkinson's and Alzheimer's diseases   People following the diet may have longer life expectancies and lower rates of chronic diseases    The Dietary Guidelines for Americans recommends the Mediterranean diet as an eating plan to promote health and prevent disease  What Is the Mediterranean Diet?    Healthy eating plan based on typical foods and recipes of Mediterranean-style cooking   The diet is primarily a plant based diet; these foods should make up a majority of meals   Starches - Plant based foods should make up a majority of meals - They are an important sources of vitamins, minerals, energy, antioxidants, and fiber - Choose whole grains, foods high in fiber and minimally processed items  - Typical grain sources include wheat, oats, barley, corn, brown rice, bulgar, farro, millet, polenta, couscous  - Various types of beans include chickpeas, lentils, fava beans, black beans, white beans   Fruits  Veggies - Large quantities of antioxidant rich fruits & veggies; 6 or more servings  - Vegetables can be eaten raw or lightly drizzled with oil and cooked  - Vegetables common to the traditional Mediterranean Diet include: artichokes, arugula, beets, broccoli, brussel sprouts, cabbage, carrots, celery, collard greens, cucumbers, eggplant, kale, leeks, lemons, lettuce, mushrooms, okra, onions, peas, peppers, potatoes, pumpkin, radishes, rutabaga, shallots, spinach, sweet potatoes, turnips, zucchini - Fruits common to the Mediterranean Diet include: apples, apricots, avocados, cherries, clementines, dates, figs,  grapefruits, grapes, melons, nectarines, oranges, peaches, pears, pomegranates, strawberries, tangerines  Fats - Replace butter and margarine with healthy oils, such as olive oil, canola oil, and tahini  - Limit nuts to no more than a handful a day  - Nuts include walnuts, almonds, pecans, pistachios, pine nuts  - Limit or avoid candied, honey roasted or heavily salted nuts - Olives are central to the [employer] - can be eaten whole or used in a variety of dishes   Meats Protein - Limiting red meat: no more than a few times a month - When eating red meat: choose lean cuts and keep the portion to the size of deck of cards - Eggs: approx. 0 to 4 times a week  - Fish and lean poultry: at least 2 a week  - Healthy protein sources include, chicken, [place], lean beef, lamb - Increase intake of seafood such as tuna, salmon, trout, mackerel, shrimp, scallops - Avoid or limit high fat processed meats such as sausage and bacon  Dairy - Include moderate amounts of low fat dairy products  - Focus on healthy dairy such as fat free yogurt, skim milk, low or reduced fat cheese - Limit dairy products higher in fat such as whole or 2% milk, cheese, ice cream  Alcohol - Moderate amounts of red wine is ok  - No more than 5 oz daily for women (all ages) and men older than age 80  - No more than 10 oz of wine daily for men younger than 44  Other - Limit sweets and other desserts  - Use herbs and spices instead of salt  to flavor foods  - Herbs and spices common to the traditional Mediterranean Diet include: basil, bay leaves, chives, cloves, cumin, fennel, garlic, lavender, marjoram, mint, oregano, parsley, pepper, rosemary, sage, savory, sumac, tarragon, thyme   It's not just a diet, it's a lifestyle:    The Mediterranean diet includes lifestyle factors typical of those in the region    Foods, drinks and meals are best eaten with others and savored   Daily physical activity is important for overall good  health   This could be strenuous exercise like running and aerobics   This could also be more leisurely activities such as walking, housework, yard-work, or taking the stairs   Moderation is the key; a balanced and healthy diet accommodates most foods and drinks   Consider portion sizes and frequency of consumption of certain foods   Meal Ideas & Options:    Breakfast:  o Whole wheat toast or whole wheat English muffins with peanut butter & hard boiled egg o Steel cut oats topped with apples & cinnamon and skim milk  o Fresh fruit: banana, strawberries, melon, berries, peaches  o Smoothies: strawberries, bananas, greek yogurt, peanut butter o Low fat greek yogurt with blueberries and granola  o Egg white omelet with spinach and mushrooms o Breakfast couscous: whole wheat couscous, apricots, skim milk, cranberries    Sandwiches:  o Hummus and grilled vegetables (peppers, zucchini, squash) on whole wheat bread   o Grilled chicken on whole wheat pita with lettuce, tomatoes, cucumbers or tzatziki  o Tuna salad on whole wheat bread: tuna salad made with greek yogurt, olives, red peppers, capers, green onions o Garlic rosemary lamb pita: lamb sauted with garlic, rosemary, salt & pepper; add lettuce, cucumber, greek yogurt to pita - flavor with lemon juice and black pepper    Seafood:  o Mediterranean grilled salmon, seasoned with garlic, basil, parsley, lemon juice and black pepper o Shrimp, lemon, and spinach whole-grain pasta salad made with low fat greek yogurt  o Seared scallops with lemon orzo  o Seared tuna steaks seasoned salt, pepper, coriander topped with tomato mixture of olives, tomatoes, olive oil, minced garlic, parsley, green onions and cappers    Meats:  o Herbed greek chicken salad with kalamata olives, cucumber, feta  o Red bell peppers stuffed with spinach, bulgur, lean ground beef (or lentils) & topped with feta   o Kebabs: skewers of chicken, tomatoes, onions, zucchini,  squash  o [place] burgers: made with red onions, mint, dill, lemon juice, feta cheese topped with roasted red peppers   Vegetarian o Cucumber salad: cucumbers, artichoke hearts, celery, red onion, feta cheese, tossed in olive oil & lemon juice  o Hummus and whole grain pita points with a greek salad (lettuce, tomato, feta, olives, cucumbers, red onion) o Lentil soup with celery, carrots made with vegetable broth, garlic, salt and pepper  o Tabouli salad: parsley, bulgur, mint, scallions, cucumbers, tomato, radishes, lemon juice, olive oil, salt and pepper. o

## 2014-07-16 NOTE — Progress Notes (Signed)
Quick Note:  Please advise pt regarding following labs... Lipid panel shows elevated triglycerides; eating healthier can help reduce this value. Continue taking current medications; take Fish Oil daily as we discussed.  Your blood sugar is very slightly above normal. Your labs will continued to be monitored.  Contact the clinic if you have questions or concerns.  Copy to pt. ______

## 2015-01-04 ENCOUNTER — Ambulatory Visit: Payer: 59 | Admitting: Physician Assistant

## 2015-06-18 ENCOUNTER — Other Ambulatory Visit: Payer: Self-pay | Admitting: Family Medicine

## 2015-06-27 ENCOUNTER — Ambulatory Visit (INDEPENDENT_AMBULATORY_CARE_PROVIDER_SITE_OTHER): Payer: 59 | Admitting: Family Medicine

## 2015-06-27 VITALS — BP 122/90 | HR 82 | Temp 98.0°F | Resp 18 | Ht 71.0 in | Wt 190.0 lb

## 2015-06-27 DIAGNOSIS — F411 Generalized anxiety disorder: Secondary | ICD-10-CM

## 2015-06-27 DIAGNOSIS — Z0000 Encounter for general adult medical examination without abnormal findings: Secondary | ICD-10-CM

## 2015-06-27 DIAGNOSIS — Z111 Encounter for screening for respiratory tuberculosis: Secondary | ICD-10-CM

## 2015-06-27 DIAGNOSIS — E789 Disorder of lipoprotein metabolism, unspecified: Secondary | ICD-10-CM | POA: Diagnosis not present

## 2015-06-27 DIAGNOSIS — Z7689 Persons encountering health services in other specified circumstances: Secondary | ICD-10-CM | POA: Diagnosis not present

## 2015-06-27 LAB — LIPID PANEL
CHOL/HDL RATIO: 2.9 ratio (ref <=5.0)
CHOLESTEROL: 189 mg/dL (ref 125–200)
HDL: 65 mg/dL (ref >=40)
LDL Cholesterol: 86 mg/dL (ref <130)
TRIGLYCERIDES: 190 mg/dL — AB (ref <150)
VLDL: 38 mg/dL — AB (ref <30)

## 2015-06-27 LAB — COMPREHENSIVE METABOLIC PANEL
ALK PHOS: 50 U/L (ref 40–115)
ALT: 17 U/L (ref 9–46)
AST: 25 U/L (ref 10–35)
Albumin: 4.5 g/dL (ref 3.6–5.1)
BUN: 24 mg/dL (ref 7–25)
CALCIUM: 9.6 mg/dL (ref 8.6–10.3)
CO2: 25 mmol/L (ref 20–31)
Chloride: 105 mmol/L (ref 98–110)
Creat: 0.78 mg/dL (ref 0.70–1.33)
GLUCOSE: 90 mg/dL (ref 65–99)
POTASSIUM: 4.9 mmol/L (ref 3.5–5.3)
Sodium: 140 mmol/L (ref 135–146)
Total Bilirubin: 0.5 mg/dL (ref 0.2–1.2)
Total Protein: 7 g/dL (ref 6.1–8.1)

## 2015-06-27 NOTE — Progress Notes (Signed)
Physical examination:  History: 54 year old male who is here for his physical examination. 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 He has a form that needs to be completed.  No major acute medical complaints.  Past medical history: Previous illnesses: History of allergies. He has some skin eczema is. He has a mild anxiety which is controlled well on medication. He has a history of hyperlipidemia despite taking medications. Surgical history: He has had surgery on his right shoulder for a torn labrum and on his right knee for a meniscal tear. He's had a hemorrhoidectomy. Medications Zetia 10 mg Sertraline 25 mg Crestor 20 mg Medication allergies, uncertain of name  Family history: Father died of non-Hodgkin's lymphoma. Mother is living at 72 years of age, has high cholesterol. 2 brothers are living, one of whom has high cholesterol  Social history: Patient is married to a lady who is a Marine scientist at the [employer]. He has 2 children 17 and 12. He works as a Garment/textile technologist. He does a lot of regular exercise, running, walking, and other stuff.  Review of systems:  Constitutional: Unremarkable HEENT: Unremarkable Cardiac breast are: Unremarkable Muscular: Unremarkable Gastrointestinal: Unremarkable Genitourinary: Unremarkable Neurologic: Unremarkable Psychiatric: Unremarkable Hematologic: Unremarkable Dermatologic: Unremarkable Endocrinologic: Unremarkable  Physical exam: Well-developed well-nourished male in no acute distress. TMs normal. Throat clear. Eyes PERRLA. Fundi benign. Neck supple without nodes or thyromegaly. No carotid bruits. Chest clear process. Heart regular without murmurs gallops or arrhythmias. Abdomen soft without organomegaly, masses, or tenderness,. Normal male external genitalia. Digital rectal exam reveals prostate gland to be normal in size and contour. Extremities unremarkable. Skin  unremarkable.  Assessment: Normal physical examination Anxiety  Hyperlipidemia  Plan: Labs ordered. Results for orders placed or performed in visit on 06/27/15  Lipid panel  Result Value Ref Range   Cholesterol 189 125 - 200 mg/dL   Triglycerides 190 (H) <150 mg/dL   HDL 65 >=40 mg/dL   Total CHOL/HDL Ratio 2.9 <=5.0 Ratio   VLDL 38 (H) <30 mg/dL   LDL Cholesterol 86 <130 mg/dL  Comprehensive metabolic panel  Result Value Ref Range   Sodium 140 135 - 146 mmol/L   Potassium 4.9 3.5 - 5.3 mmol/L   Chloride 105 98 - 110 mmol/L   CO2 25 20 - 31 mmol/L   Glucose, Bld 90 65 - 99 mg/dL   BUN 24 7 - 25 mg/dL   Creat 0.78 0.70 - 1.33 mg/dL   Total Bilirubin 0.5 0.2 - 1.2 mg/dL   Alkaline Phosphatase 50 40 - 115 U/L   AST 25 10 - 35 U/L   ALT 17 9 - 46 U/L   Total Protein 7.0 6.1 - 8.1 g/dL   Albumin 4.5 3.6 - 5.1 g/dL   Calcium 9.6 8.6 - 10.3 mg/dL  TB Skin Test  Result Value Ref Range   TB Skin Test Negative    Induration 0 mm

## 2015-06-29 ENCOUNTER — Ambulatory Visit (INDEPENDENT_AMBULATORY_CARE_PROVIDER_SITE_OTHER): Payer: 59 | Admitting: *Deleted

## 2015-06-29 DIAGNOSIS — Z111 Encounter for screening for respiratory tuberculosis: Secondary | ICD-10-CM

## 2015-06-29 DIAGNOSIS — Z7689 Persons encountering health services in other specified circumstances: Secondary | ICD-10-CM

## 2015-06-29 LAB — TB SKIN TEST
INDURATION: 0 mm
TB SKIN TEST: NEGATIVE

## 2015-07-03 NOTE — Progress Notes (Signed)
hyperlipidemia Subjective:  Patient ID: Timothy Hoffman, male    DOB: 1961/07/02  Age: 54 y.o. MRN: [id number]  Refill for meds    Objective:   No CV symptoms.  Will recheck lipids  Assessment & Plan:   Assessment:  Hyperlipidemia  Plan:  rx crestor  Will inform him of lab results.  Joeline Freer, MD 07/03/2015

## 2015-07-17 ENCOUNTER — Other Ambulatory Visit: Payer: Self-pay | Admitting: Physician Assistant

## 2015-07-18 ENCOUNTER — Other Ambulatory Visit: Payer: Self-pay

## 2015-07-18 ENCOUNTER — Telehealth: Payer: Self-pay

## 2015-07-18 DIAGNOSIS — E782 Mixed hyperlipidemia: Secondary | ICD-10-CM

## 2015-07-18 MED ORDER — ROSUVASTATIN CALCIUM 40 MG PO TABS: 40.0000 mg | ORAL_TABLET | Freq: Every day | ORAL | Status: DC

## 2015-07-18 MED ORDER — ZETIA 10 MG PO TABS: 10.0000 mg | ORAL_TABLET | Freq: Every day | ORAL | Status: DC

## 2015-07-18 NOTE — Telephone Encounter (Signed)
Got req for RFs of Crestor 40 mg from pharm, also requesting change to generic instead of name brand. I called pharm to check what strength pt has been taking bc we most recently have the 20 mg in our computer (although I did not see any notes to reduce this from the 40 mg). Pharm reported that pt has been taking the 40 mg, and since Dr Linna Darner wanted pt to remain on current dose will send in new Rx for this.

## 2015-07-20 ENCOUNTER — Other Ambulatory Visit: Payer: Self-pay

## 2015-07-20 MED ORDER — SERTRALINE HCL 25 MG PO TABS: 25.0000 mg | ORAL_TABLET | Freq: Every day | ORAL | Status: DC

## 2015-11-15 MED FILL — SERTRALINE HCL 25 MG TABLET: 25 | 30 days supply | Qty: 30 | Fill #4

## 2015-11-19 ENCOUNTER — Other Ambulatory Visit: Payer: Self-pay

## 2015-11-19 MED ORDER — ZETIA 10 MG PO TABS: 10.0000 mg | ORAL_TABLET | Freq: Every day | ORAL | Status: DC

## 2015-11-21 MED FILL — EZETIMIBE 10 MG TABLET: 10 | 30 days supply | Qty: 30 | Fill #0

## 2015-11-22 ENCOUNTER — Other Ambulatory Visit: Payer: Self-pay | Admitting: *Deleted

## 2015-11-22 MED ORDER — EZETIMIBE 10 MG PO TABS: 10.0000 mg | ORAL_TABLET | Freq: Every day | ORAL | Status: DC

## 2015-12-20 MED FILL — SERTRALINE HCL 25 MG TABLET: 25 | 30 days supply | Qty: 30 | Fill #5

## 2016-01-14 ENCOUNTER — Telehealth: Payer: Self-pay

## 2016-01-14 NOTE — Telephone Encounter (Signed)
Pt requesting call back from [facility] in re to questions re medicines  Best phone for pt is [phone]

## 2016-01-15 ENCOUNTER — Other Ambulatory Visit: Payer: Self-pay | Admitting: Family Medicine

## 2016-01-16 NOTE — Telephone Encounter (Signed)
Left message for pt to call back  °

## 2016-01-17 MED ORDER — EZETIMIBE 10 MG PO TABS: 10.0000 mg | ORAL_TABLET | Freq: Every day | ORAL | Status: DC

## 2016-01-17 MED FILL — SERTRALINE HCL 25 MG TABLET: 25 | 30 days supply | Qty: 30 | Fill #0

## 2016-01-17 MED FILL — ROSUVASTATIN CALCIUM 40 MG: 40 | 90 days supply | Qty: 90 | Fill #0

## 2016-01-17 MED FILL — EZETIMIBE 10 MG TABLET: 10 | 90 days supply | Qty: 90 | Fill #0

## 2016-01-17 NOTE — Telephone Encounter (Signed)
Sent in Zetia 

## 2016-02-18 MED FILL — SERTRALINE HCL 25 MG TABLET: 25 | 30 days supply | Qty: 30 | Fill #1

## 2016-03-18 MED FILL — SERTRALINE HCL 25 MG TABLET: 25 | 30 days supply | Qty: 30 | Fill #2

## 2016-04-17 MED FILL — EZETIMIBE 10 MG TABLET: 10 | 30 days supply | Qty: 30 | Fill #0

## 2016-04-17 MED FILL — SERTRALINE HCL 25 MG TABLET: 25 | 30 days supply | Qty: 30 | Fill #3

## 2016-04-17 MED FILL — ROSUVASTATIN CALCIUM 40 MG: 40 | 90 days supply | Qty: 90 | Fill #1

## 2016-05-16 ENCOUNTER — Ambulatory Visit: Payer: 59

## 2016-05-16 ENCOUNTER — Telehealth: Payer: Self-pay

## 2016-05-16 MED FILL — SERTRALINE HCL 25 MG TABLET: 25 | 30 days supply | Qty: 30 | Fill #4

## 2016-05-16 NOTE — Telephone Encounter (Signed)
Patient came in for be seen for labs only. I informed patient that there are no orders and no messages stating he's supposed to have lab work done. I also checked with clinical. Patient stated that whoever he spoke with told him that he can come in to do lab work to get a week trial prescription to see if a different medication will work. Patient left because he was expecting to be fast tracked. Please call patient!  [phone]

## 2016-05-19 NOTE — Telephone Encounter (Signed)
I do not see any indication of who could have called this pt. The last time we saw this pt. Was august of last year.

## 2016-05-21 ENCOUNTER — Other Ambulatory Visit: Payer: Self-pay

## 2016-05-21 MED ORDER — EZETIMIBE 10 MG PO TABS: 10.0000 mg | ORAL_TABLET | Freq: Every day | ORAL | Status: DC

## 2016-05-21 MED FILL — EZETIMIBE 10 MG TABLET: 10 | 30 days supply | Qty: 30 | Fill #0

## 2016-06-16 MED FILL — SERTRALINE HCL 25 MG TABLET: 25 | 30 days supply | Qty: 30 | Fill #5

## 2016-06-20 ENCOUNTER — Other Ambulatory Visit: Payer: Self-pay | Admitting: Physician Assistant

## 2016-06-24 ENCOUNTER — Encounter: Payer: Self-pay | Admitting: Physician Assistant

## 2016-06-24 ENCOUNTER — Ambulatory Visit (INDEPENDENT_AMBULATORY_CARE_PROVIDER_SITE_OTHER): Payer: 59 | Admitting: Physician Assistant

## 2016-06-24 VITALS — BP 130/94 | HR 87 | Temp 98.4°F | Resp 17 | Ht 71.0 in | Wt 189.0 lb

## 2016-06-24 DIAGNOSIS — Z1159 Encounter for screening for other viral diseases: Secondary | ICD-10-CM | POA: Diagnosis not present

## 2016-06-24 DIAGNOSIS — Z125 Encounter for screening for malignant neoplasm of prostate: Secondary | ICD-10-CM | POA: Diagnosis not present

## 2016-06-24 DIAGNOSIS — E789 Disorder of lipoprotein metabolism, unspecified: Secondary | ICD-10-CM

## 2016-06-24 DIAGNOSIS — Z0000 Encounter for general adult medical examination without abnormal findings: Secondary | ICD-10-CM | POA: Diagnosis not present

## 2016-06-24 DIAGNOSIS — F411 Generalized anxiety disorder: Secondary | ICD-10-CM

## 2016-06-24 DIAGNOSIS — R030 Elevated blood-pressure reading, without diagnosis of hypertension: Secondary | ICD-10-CM | POA: Diagnosis not present

## 2016-06-24 DIAGNOSIS — Z23 Encounter for immunization: Secondary | ICD-10-CM | POA: Diagnosis not present

## 2016-06-24 DIAGNOSIS — Z114 Encounter for screening for human immunodeficiency virus [HIV]: Secondary | ICD-10-CM | POA: Diagnosis not present

## 2016-06-24 DIAGNOSIS — R972 Elevated prostate specific antigen [PSA]: Secondary | ICD-10-CM | POA: Diagnosis not present

## 2016-06-24 LAB — COMPREHENSIVE METABOLIC PANEL
ALK PHOS: 53 U/L (ref 40–115)
ALT: 27 U/L (ref 9–46)
AST: 33 U/L (ref 10–35)
Albumin: 4.2 g/dL (ref 3.6–5.1)
BUN: 17 mg/dL (ref 7–25)
CHLORIDE: 105 mmol/L (ref 98–110)
CO2: 22 mmol/L (ref 20–31)
Calcium: 9.3 mg/dL (ref 8.6–10.3)
Creat: 0.94 mg/dL (ref 0.70–1.33)
Glucose, Bld: 103 mg/dL — ABNORMAL HIGH (ref 65–99)
POTASSIUM: 5 mmol/L (ref 3.5–5.3)
Sodium: 140 mmol/L (ref 135–146)
TOTAL PROTEIN: 7.2 g/dL (ref 6.1–8.1)
Total Bilirubin: 0.7 mg/dL (ref 0.2–1.2)

## 2016-06-24 LAB — LIPID PANEL
Cholesterol: 188 mg/dL (ref 125–200)
HDL: 70 mg/dL (ref >=40)
LDL CALC: 76 mg/dL (ref <130)
Total CHOL/HDL Ratio: 2.7 Ratio (ref <=5.0)
Triglycerides: 209 mg/dL — ABNORMAL HIGH (ref <150)
VLDL: 42 mg/dL — ABNORMAL HIGH (ref <30)

## 2016-06-24 LAB — CBC WITH DIFFERENTIAL/PLATELET
BASOS PCT: 1 %
Basophils Absolute: 37 cells/uL (ref 0–200)
EOS ABS: 74 {cells}/uL (ref 15–500)
EOS PCT: 2 %
HCT: 42.4 % (ref 38.5–50.0)
HEMOGLOBIN: 13.7 g/dL (ref 13.2–17.1)
LYMPHS ABS: 999 {cells}/uL (ref 850–3900)
Lymphocytes Relative: 27 %
MCH: 27.6 pg (ref 27.0–33.0)
MCHC: 32.3 g/dL (ref 32.0–36.0)
MCV: 85.3 fL (ref 80.0–100.0)
MONOS PCT: 14 %
MPV: 10.4 fL (ref 7.5–12.5)
Monocytes Absolute: 518 cells/uL (ref 200–950)
NEUTROS ABS: 2072 {cells}/uL (ref 1500–7800)
Neutrophils Relative %: 56 %
PLATELETS: 296 10*3/uL (ref 140–400)
RBC: 4.97 MIL/uL (ref 4.20–5.80)
RDW: 16.3 % — ABNORMAL HIGH (ref 11.0–15.0)
WBC: 3.7 10*3/uL — ABNORMAL LOW (ref 3.8–10.8)

## 2016-06-24 MED ORDER — ROSUVASTATIN CALCIUM 40 MG PO TABS: 40.0000 mg | ORAL_TABLET | Freq: Every day | ORAL | 3 refills | Status: DC

## 2016-06-24 MED ORDER — SERTRALINE HCL 25 MG PO TABS: ORAL_TABLET | ORAL | 3 refills | Status: DC

## 2016-06-24 MED ORDER — EZETIMIBE 10 MG PO TABS: 10.0000 mg | ORAL_TABLET | Freq: Every day | ORAL | 3 refills | Status: DC

## 2016-06-24 MED FILL — EZETIMIBE 10 MG TABLET: 10 | 90 days supply | Qty: 90 | Fill #0

## 2016-06-24 NOTE — Patient Instructions (Addendum)
[facility] by [facility]  Contact [facility] to schedule a follow-up colonoscopy for 10/2016.    IF you received an x-ray today, you will receive an invoice from [facility]. Please contact [facility] at [phone] with questions or concerns regarding your invoice.   IF you received labwork today, you will receive an invoice from [facility]. Please contact [facility] at [phone] with questions or concerns regarding your invoice.   Our billing staff will not be able to assist you with questions regarding bills from these companies.  You will be contacted with the lab results as soon as they are available. The fastest way to get your results is to activate your My Chart account. Instructions are located on the last page of this paperwork. If you have not heard from [place] regarding the results in 2 weeks, please contact this office.

## 2016-06-24 NOTE — Progress Notes (Signed)
Patient ID: Timothy Hoffman, male    DOB: 12/04/1960, 55 y.o.   MRN: [id number]  PCP: Harrison Mons, PA-C  Chief Complaint  Patient presents with  . Medication Refill    crestor, zetia and zoloft     Subjective:   HPI: Presents for [employer].  Colorectal Cancer Screening: 2012, to repeat in 10/2016 Prostate Cancer Screening: PSA today Bone Density Testing: not yet HIV Screening: not yet STI Screening: very low risk Seasonal Influenza Vaccination: not yet Td/Tdap Vaccination: 2012  Pneumococcal Vaccination: not yet a candidate Zoster Vaccination: not yet a candidate Frequency of Dental evaluation: annually, schedule for this year Frequency of Eye evaluation: annually Sabra Heck)     Patient Active Problem List   Diagnosis Date Noted  . Lipid disorder 07/13/2014  . Elevated blood-pressure reading without diagnosis of hypertension 07/13/2014  . Generalized anxiety disorder 06/02/2013    Past Medical History:  Diagnosis Date  . Allergy    SEASONAL  . Anemia   . Anxiety   . Arrhythmia 1999  . Hyperlipemia   . Internal hemorrhoid, bleeding 2013     Prior to Admission medications   Medication Sig Start Date End Date Taking? Authorizing Provider  DESONIDE EX Apply topically.   Yes Historical Provider, MD  ezetimibe (ZETIA) 10 MG tablet Take 1 tablet (10 mg total) by mouth daily. 05/21/16  Yes Sarah Alleen Borne, PA-C  rosuvastatin (CRESTOR) 40 MG tablet TAKE 1 TABLET BY MOUTH DAILY. 01/16/16  Yes Posey Boyer, MD  sertraline (ZOLOFT) 25 MG tablet TAKE 1 TABLET (25 MG TOTAL) BY MOUTH DAILY. 01/16/16  Yes Posey Boyer, MD  triamcinolone cream (KENALOG) 0.1 % Apply topically daily.   Yes Historical Provider, MD    Allergies  Allergen Reactions  . Tequin Rash    Past Surgical History:  Procedure Laterality Date  . FRACTURE SURGERY    . KNEE ARTHROSCOPY  2012  . Right shoulder scope      Family History  Problem Relation Age of Onset  . Lymphoma Father    . Hypertension Mother   . Hyperlipidemia Mother   . Hypertension Brother   . Hyperlipidemia Brother   . Mental illness Son     Anxiety  . Stroke Maternal Grandmother     Social History   Social History  . Marital status: Married    Spouse name: Erline Levine  . Number of children: 2  . Years of education: college   Occupational History  . 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 Smoking status: Never Smoker  . Smokeless tobacco: Never Used     Comment: 20 yrs ago   . Alcohol use 1.2 oz/week    2 Standard drinks or equivalent per week  . Drug use: No  . Sexual activity: Yes    Partners: Female   Other Topics Concern  . None   Social History Narrative   Lives with his wife and their 2 children.     His wife is an Therapist, sports.     Their son has significant anxiety.   He exercises by running 3-5 miles 4x/week.       Review of Systems  Constitutional: Negative.   HENT: Negative.   Eyes: Negative.   Respiratory: Negative.   Cardiovascular: Negative.   Gastrointestinal: Negative.   Genitourinary: Negative.   Musculoskeletal: Negative.   Skin: Negative.   Neurological: Negative.   Psychiatric/Behavioral: Negative.  Objective:  Physical Exam  Constitutional: He is oriented to person, place, and time. He appears well-developed and well-nourished. He is active and cooperative.  Non-toxic appearance. He does not have a sickly appearance. He does not appear ill. No distress.  BP (!) 130/94 (BP Location: Right Arm, Patient Position: Sitting, Cuff Size: Normal)   Pulse 87   Temp 98.4 F (36.9 C) (Oral)   Resp 17   Ht 5\' 11"  (1.803 m)   Wt 189 lb (85.7 kg)   SpO2 98%   BMI 26.36 kg/m    HENT:  Head: Normocephalic and atraumatic.  Right Ear: Hearing, tympanic membrane, external ear and ear canal normal.  Left Ear: Hearing, tympanic membrane, external ear and ear canal normal.  Nose: Nose normal.  Mouth/Throat: Uvula is midline, oropharynx is  clear and moist and mucous membranes are normal. He does not have dentures. No oral lesions. No trismus in the jaw. Normal dentition. No dental abscesses, uvula swelling, lacerations or dental caries.  Eyes: Conjunctivae, EOM and lids are normal. Pupils are equal, round, and reactive to light. Right eye exhibits no discharge. Left eye exhibits no discharge. No scleral icterus.  Fundoscopic exam:      The right eye shows no arteriolar narrowing, no AV nicking, no exudate, no hemorrhage and no papilledema.       The left eye shows no arteriolar narrowing, no AV nicking, no exudate, no hemorrhage and no papilledema.  Neck: Normal range of motion, full passive range of motion without pain and phonation normal. Neck supple. No spinous process tenderness and no muscular tenderness present. No neck rigidity. No tracheal deviation, no edema, no erythema and normal range of motion present. No thyromegaly present.  Cardiovascular: Normal rate, regular rhythm, S1 normal, S2 normal, normal heart sounds, intact distal pulses and normal pulses.  Exam reveals no gallop and no friction rub.   No murmur heard. Pulmonary/Chest: Effort normal and breath sounds normal. No respiratory distress. He has no wheezes. He has no rales.  Abdominal: Soft. Normal appearance and bowel sounds are normal. He exhibits no distension and no mass. There is no hepatosplenomegaly. There is no tenderness. There is no rebound and no guarding. No hernia.  Musculoskeletal: Normal range of motion. He exhibits no edema or tenderness.       Cervical back: Normal. He exhibits normal range of motion, no tenderness, no bony tenderness, no swelling, no edema, no deformity, no laceration, no pain, no spasm and normal pulse.       Thoracic back: Normal.       Lumbar back: Normal.  Lymphadenopathy:       Head (right side): No submental, no submandibular, no tonsillar, no preauricular, no posterior auricular and no occipital adenopathy present.        Head (left side): No submental, no submandibular, no tonsillar, no preauricular, no posterior auricular and no occipital adenopathy present.    He has no cervical adenopathy.       Right: No supraclavicular adenopathy present.       Left: No supraclavicular adenopathy present.  Neurological: He is alert and oriented to person, place, and time. He has normal strength and normal reflexes. He displays no tremor. No cranial nerve deficit. He exhibits normal muscle tone. Coordination and gait normal.  Skin: Skin is warm, dry and intact. No abrasion, no ecchymosis, no laceration, no lesion and no rash noted. He is not diaphoretic. No cyanosis or erythema. No pallor. Nails show no clubbing.  Psychiatric: He  has a normal mood and affect. His speech is normal and behavior is normal. Judgment and thought content normal. Cognition and memory are normal.           Assessment & Plan:  1. Annual physical exam Age appropriate anticipatory guidance provided.  2. Lipid disorder Await labs. Adjust regimen as indicated by results. - Lipid panel - ezetimibe (ZETIA) 10 MG tablet; Take 1 tablet (10 mg total) by mouth daily.  Dispense: 90 tablet; Refill: 3 - rosuvastatin (CRESTOR) 40 MG tablet; Take 1 tablet (40 mg total) by mouth daily.  Dispense: 90 tablet; Refill: 3  3. Generalized anxiety disorder Controlled. Stable. Continue current treatment. - sertraline (ZOLOFT) 25 MG tablet; TAKE 1 TABLET (25 MG TOTAL) BY MOUTH DAILY.  Dispense: 90 tablet; Refill: 3  4. Elevated blood-pressure reading without diagnosis of hypertension Controlled. Stable. Continue current treatment. - CBC with Differential/Platelet - Comprehensive metabolic panel  5. Screening for prostate cancer - PSA  6. Screening for HIV (human immunodeficiency virus) - HIV antibody  7. Need for hepatitis C screening test - Hepatitis C antibody  8. Need for influenza vaccination - Flu Vaccine QUAD 36+ mos IM    Fara Chute,  PA-C Physician Assistant-Certified [facility]

## 2016-06-25 LAB — PSA: PSA: 2 ng/mL (ref <=4.0)

## 2016-06-25 LAB — HIV ANTIBODY (ROUTINE TESTING W REFLEX): HIV: NONREACTIVE

## 2016-06-25 LAB — HEPATITIS C ANTIBODY: HCV AB: NEGATIVE

## 2016-07-15 MED FILL — ROSUVASTATIN CALCIUM 40 MG: 40 | 90 days supply | Qty: 90 | Fill #2

## 2016-07-15 MED FILL — SERTRALINE HCL 25 MG TABLET: 25 | 90 days supply | Qty: 90 | Fill #0

## 2016-09-05 ENCOUNTER — Encounter: Payer: Self-pay | Admitting: Internal Medicine

## 2016-09-19 MED FILL — EZETIMIBE 10 MG TABLET: 10 | 90 days supply | Qty: 90 | Fill #1

## 2016-10-17 MED FILL — ROSUVASTATIN CALCIUM 40 MG: 40 | 90 days supply | Qty: 90 | Fill #3

## 2016-10-23 MED FILL — SERTRALINE HCL 25 MG TABLET: 25 | 90 days supply | Qty: 90 | Fill #1

## 2016-11-04 ENCOUNTER — Encounter: Payer: Self-pay | Admitting: Internal Medicine

## 2016-12-05 ENCOUNTER — Telehealth: Payer: Self-pay | Admitting: *Deleted

## 2016-12-05 NOTE — Telephone Encounter (Signed)
Pt called back and rescheduled previsit to 12/09/16 130 pm room 51

## 2016-12-05 NOTE — Telephone Encounter (Signed)
Pt no show for previsit 12/05/16. Phone call to notify that appt needs to be rescheduled by 5 pm today in order to keep colon scheduled for Monday 12/22/16. If PV not rescheduled today before 5 pm, colon scheduled 12/22/16 will be cancelled and both appts will need to be rescheduled.

## 2016-12-09 ENCOUNTER — Ambulatory Visit (AMBULATORY_SURGERY_CENTER): Payer: Self-pay

## 2016-12-09 VITALS — Ht 69.5 in | Wt 186.0 lb

## 2016-12-09 DIAGNOSIS — R972 Elevated prostate specific antigen [PSA]: Secondary | ICD-10-CM | POA: Diagnosis not present

## 2016-12-09 DIAGNOSIS — Z860100 Personal history of colon polyps, unspecified: Secondary | ICD-10-CM

## 2016-12-09 MED ORDER — SUPREP BOWEL PREP KIT 17.5-3.13-1.6 GM/177ML PO SOLN: 1.0000 | Freq: Once | ORAL | 0 refills | Status: AC

## 2016-12-09 MED FILL — SUPREP BOWEL PREP KIT: 17.5-3.13-1 | 1 days supply | Qty: 354 | Fill #0

## 2016-12-09 NOTE — Progress Notes (Signed)
No allergies to eggs or soy No diet meds No home oxygen No past problems with anesthesia  Declined emmi 

## 2016-12-10 ENCOUNTER — Encounter: Payer: Self-pay | Admitting: Internal Medicine

## 2016-12-15 ENCOUNTER — Encounter: Payer: Self-pay | Admitting: Physician Assistant

## 2016-12-15 DIAGNOSIS — R972 Elevated prostate specific antigen [PSA]: Secondary | ICD-10-CM | POA: Insufficient documentation

## 2016-12-18 MED FILL — EZETIMIBE 10 MG TABLET: 10 | 90 days supply | Qty: 90 | Fill #2

## 2016-12-22 ENCOUNTER — Ambulatory Visit (AMBULATORY_SURGERY_CENTER): Payer: 59 | Admitting: Internal Medicine

## 2016-12-22 ENCOUNTER — Encounter: Payer: Self-pay | Admitting: Internal Medicine

## 2016-12-22 VITALS — BP 137/86 | HR 66 | Temp 98.9°F | Resp 12 | Ht 69.0 in | Wt 186.0 lb

## 2016-12-22 DIAGNOSIS — D123 Benign neoplasm of transverse colon: Secondary | ICD-10-CM

## 2016-12-22 DIAGNOSIS — I10 Essential (primary) hypertension: Secondary | ICD-10-CM | POA: Diagnosis not present

## 2016-12-22 DIAGNOSIS — Z86010 Personal history of colonic polyps: Secondary | ICD-10-CM | POA: Diagnosis present

## 2016-12-22 DIAGNOSIS — F419 Anxiety disorder, unspecified: Secondary | ICD-10-CM | POA: Diagnosis not present

## 2016-12-22 MED ORDER — SODIUM CHLORIDE 0.9 % IV SOLN: 500.0000 mL | INTRAVENOUS | Status: DC

## 2016-12-22 NOTE — Progress Notes (Signed)
Called to room to assist during endoscopic procedure.  Patient ID and intended procedure confirmed with present staff. Received instructions for my participation in the procedure from the performing physician.  

## 2016-12-22 NOTE — Op Note (Signed)
[facility] Patient Name: Timothy Hoffman Procedure Date: 12/22/2016 9:18 AM MRN: [id number] Endoscopist: Docia Chuck. Henrene Pastor , MD Age: 55 Referring MD:  Date of Birth: 1961/09/22 Gender: Male Account #: [id number] Procedure:                Colonoscopy, with cold snare polypectomy X3 Indications:              High risk colon cancer surveillance: Personal                            history of non-advanced adenoma. Index exam                            December 2012 Medicines:                Monitored Anesthesia Care Procedure:                Pre-Anesthesia Assessment:                           - Prior to the procedure, a History and Physical                            was performed, and patient medications and                            allergies were reviewed. The patient's tolerance of                            previous anesthesia was also reviewed. The risks                            and benefits of the procedure and the sedation                            options and risks were discussed with the patient.                            All questions were answered, and informed consent                            was obtained. Prior Anticoagulants: The patient has                            taken no previous anticoagulant or antiplatelet                            agents. ASA Grade Assessment: II - A patient with                            mild systemic disease. After reviewing the risks                            and benefits, the patient was deemed in  satisfactory condition to undergo the procedure.                           After obtaining informed consent, the colonoscope                            was passed under direct vision. Throughout the                            procedure, the patient's blood pressure, pulse, and                            oxygen saturations were monitored continuously. The                            Model CF-HQ190L [id number])  scope was introduced                            through the anus and advanced to the the cecum,                            identified by appendiceal orifice and ileocecal                            valve. The ileocecal valve, appendiceal orifice,                            and rectum were photographed. The quality of the                            bowel preparation was excellent. The colonoscopy                            was performed without difficulty. The patient                            tolerated the procedure well. The bowel preparation                            used was SUPREP. Scope In: 9:27:13 AM Scope Out: 9:43:19 AM Scope Withdrawal Time: 0 hours 13 minutes 36 seconds  Total Procedure Duration: 0 hours 16 minutes 6 seconds  Findings:                 Three polyps were found in the transverse colon.                            The polyps were 2 to 4 mm in size. These polyps                            were removed with a cold snare. Resection and                            retrieval were complete.  A few small-mouthed diverticula were found in the                            sigmoid colon.                           Internal hemorrhoids were found during retroflexion.                           The exam was otherwise without abnormality on                            direct and retroflexion views. Complications:            No immediate complications. Estimated blood loss:                            None. Estimated Blood Loss:     Estimated blood loss: none. Impression:               - Three 2 to 4 mm polyps in the transverse colon,                            removed with a cold snare. Resected and retrieved.                           - Diverticulosis in the sigmoid colon.                           - Internal hemorrhoids.                           - The examination was otherwise normal on direct                            and retroflexion  views. Recommendation:           - Repeat colonoscopy in 3 - 5 years for                            surveillance.                           - Patient has a contact number available for                            emergencies. The signs and symptoms of potential                            delayed complications were discussed with the                            patient. Return to normal activities tomorrow.                            Written discharge instructions were provided to the  patient.                           - Resume previous diet.                           - Continue present medications.                           - Await pathology results. Docia Chuck. Henrene Pastor, MD 12/22/2016 9:48:41 AM This report has been signed electronically.

## 2016-12-22 NOTE — Progress Notes (Signed)
Report given to PACU, vss 

## 2016-12-22 NOTE — Patient Instructions (Signed)
Impression/Recommendations:  Polyp handout given to patient. Hemorrhoid handout given to patient. Diverticulosis handout given to patient.  Repeat colonoscopy in 3-5 years for surveillance.  YOU HAD AN ENDOSCOPIC PROCEDURE TODAY AT [facility]:   Refer to the procedure report that was given to you for any specific questions about what was found during the examination.  If the procedure report does not answer your questions, please call your gastroenterologist to clarify.  If you requested that your care partner not be given the details of your procedure findings, then the procedure report has been included in a sealed envelope for you to review at your convenience later.  YOU SHOULD EXPECT: Some feelings of bloating in the abdomen. Passage of more gas than usual.  Walking can help get rid of the air that was put into your GI tract during the procedure and reduce the bloating. If you had a lower endoscopy (such as a colonoscopy or flexible sigmoidoscopy) you may notice spotting of blood in your stool or on the toilet paper. If you underwent a bowel prep for your procedure, you may not have a normal bowel movement for a few days.  Please Note:  You might notice some irritation and congestion in your nose or some drainage.  This is from the oxygen used during your procedure.  There is no need for concern and it should clear up in a day or so.  SYMPTOMS TO REPORT IMMEDIATELY:   Following lower endoscopy (colonoscopy or flexible sigmoidoscopy):  Excessive amounts of blood in the stool  Significant tenderness or worsening of abdominal pains  Swelling of the abdomen that is new, acute  Fever of 100F or higher For urgent or emergent issues, a gastroenterologist can be reached at any hour by calling [phone].   DIET:  We do recommend a small meal at first, but then you may proceed to your regular diet.  Drink plenty of fluids but you should avoid alcoholic beverages for 24  hours.  ACTIVITY:  You should plan to take it easy for the rest of today and you should NOT DRIVE or use heavy machinery until tomorrow (because of the sedation medicines used during the test).    FOLLOW UP: Our staff will call the number listed on your records the next business day following your procedure to check on you and address any questions or concerns that you may have regarding the information given to you following your procedure. If we do not reach you, we will leave a message.  However, if you are feeling well and you are not experiencing any problems, there is no need to return our call.  We will assume that you have returned to your regular daily activities without incident.  If any biopsies were taken you will be contacted by phone or by letter within the next 1-3 weeks.  Please call us at [phone] if you have not heard about the biopsies in 3 weeks.    SIGNATURES/CONFIDENTIALITY: You and/or your care partner have signed paperwork which will be entered into your electronic medical record.  These signatures attest to the fact that that the information above on your After Visit Summary has been reviewed and is understood.  Full responsibility of the confidentiality of this discharge information lies with you and/or your care-partner.

## 2016-12-22 NOTE — Progress Notes (Signed)
Pt's states no medical or surgical changes since previsit or office visit. 

## 2016-12-23 ENCOUNTER — Telehealth: Payer: Self-pay | Admitting: *Deleted

## 2016-12-23 NOTE — Telephone Encounter (Signed)
No answer left message and will attempt to call back later this afternoon for follow up call. Sm

## 2016-12-23 NOTE — Telephone Encounter (Signed)
  Follow up Call-  Call back number 12/22/2016  Post procedure Call Back phone  # [phone]  Permission to leave phone message Yes  Some recent data might be hidden   Acadia [place]

## 2016-12-25 ENCOUNTER — Encounter: Payer: Self-pay | Admitting: Internal Medicine

## 2017-01-19 MED FILL — ROSUVASTATIN CALCIUM 40 MG: 40 | 90 days supply | Qty: 90 | Fill #0

## 2017-01-29 MED FILL — SERTRALINE HCL 25 MG TABLET: 25 | 90 days supply | Qty: 90 | Fill #2

## 2017-02-24 ENCOUNTER — Encounter: Payer: Self-pay | Admitting: Physician Assistant

## 2017-03-20 MED FILL — EZETIMIBE 10 MG TAB: 10 | 90 days supply | Qty: 90 | Fill #3

## 2017-04-16 MED FILL — ROSUVASTATIN CALCIUM 40 MG: 40 | 90 days supply | Qty: 90 | Fill #1

## 2017-05-04 MED FILL — SERTRALINE HCL 25 MG TABLET: 25 | 90 days supply | Qty: 90 | Fill #3

## 2017-06-18 ENCOUNTER — Other Ambulatory Visit: Payer: Self-pay | Admitting: Physician Assistant

## 2017-06-18 DIAGNOSIS — E789 Disorder of lipoprotein metabolism, unspecified: Secondary | ICD-10-CM

## 2017-06-22 MED FILL — EZETIMIBE 10 MG TAB: 10 | 30 days supply | Qty: 30 | Fill #0

## 2017-06-24 NOTE — Telephone Encounter (Signed)
mychart message sent to pt about making an apt for more refills °

## 2017-07-24 ENCOUNTER — Encounter: Payer: Self-pay | Admitting: Physician Assistant

## 2017-07-24 ENCOUNTER — Ambulatory Visit (INDEPENDENT_AMBULATORY_CARE_PROVIDER_SITE_OTHER): Payer: 59 | Admitting: Physician Assistant

## 2017-07-24 VITALS — BP 163/101 | HR 86 | Temp 98.2°F | Resp 18 | Ht 72.0 in | Wt 186.0 lb

## 2017-07-24 DIAGNOSIS — R030 Elevated blood-pressure reading, without diagnosis of hypertension: Secondary | ICD-10-CM | POA: Diagnosis not present

## 2017-07-24 DIAGNOSIS — R972 Elevated prostate specific antigen [PSA]: Secondary | ICD-10-CM | POA: Diagnosis not present

## 2017-07-24 DIAGNOSIS — F411 Generalized anxiety disorder: Secondary | ICD-10-CM

## 2017-07-24 DIAGNOSIS — Z23 Encounter for immunization: Secondary | ICD-10-CM

## 2017-07-24 DIAGNOSIS — E789 Disorder of lipoprotein metabolism, unspecified: Secondary | ICD-10-CM

## 2017-07-24 LAB — POCT URINALYSIS DIP (MANUAL ENTRY)
Bilirubin, UA: NEGATIVE
GLUCOSE UA: NEGATIVE mg/dL
Leukocytes, UA: NEGATIVE
Nitrite, UA: NEGATIVE
Protein Ur, POC: 30 mg/dL — AB
RBC UA: NEGATIVE
UROBILINOGEN UA: 0.2 U/dL
pH, UA: 5.5 (ref 5.0–8.0)

## 2017-07-24 MED ORDER — EZETIMIBE 10 MG PO TABS: 10.0000 mg | ORAL_TABLET | Freq: Every day | ORAL | 3 refills | Status: DC

## 2017-07-24 MED ORDER — TRIAMCINOLONE ACETONIDE 0.1 % EX CREA: TOPICAL_CREAM | Freq: Every day | CUTANEOUS | 0 refills | Status: DC

## 2017-07-24 MED ORDER — ROSUVASTATIN CALCIUM 40 MG PO TABS: 40.0000 mg | ORAL_TABLET | Freq: Every day | ORAL | 3 refills | Status: DC

## 2017-07-24 MED FILL — ROSUVASTATIN CALCIUM 40 MG: 40 | 90 days supply | Qty: 90 | Fill #0

## 2017-07-24 MED FILL — EZETIMIBE 10 MG TAB: 10 | 90 days supply | Qty: 90 | Fill #0

## 2017-07-24 MED FILL — TRIAMCINOLONE 0.1% CREAM: 0.1 | 15 days supply | Qty: 30 | Fill #0

## 2017-07-24 NOTE — Patient Instructions (Addendum)
Bring or fax results of PSA from urologist to Primary Care at [facility] please.   Check your blood pressure 2-3 times a week and record the readings. If it's consistently >140/90, let me know so that we can start medication.   IF you received an x-ray today, you will receive an invoice from [facility]. Please contact [facility] at [phone] with questions or concerns regarding your invoice.   IF you received labwork today, you will receive an invoice from [facility]. Please contact [facility] at [phone] with questions or concerns regarding your invoice.   Our billing staff will not be able to assist you with questions regarding bills from these companies.  You will be contacted with the lab results as soon as they are available. The fastest way to get your results is to activate your My Chart account. Instructions are located on the last page of this paperwork. If you have not heard from [place] regarding the results in 2 weeks, please contact this office.

## 2017-07-24 NOTE — Progress Notes (Signed)
Subjective:    Patient ID: Timothy Hoffman, male    DOB: 1961-04-09, 56 y.o.   MRN: [id number]  HPI Patient lost job at end of this past July, cause some anxiety and stress. He states he has been reading and working out more to cope with it. Exercise daily with cardio and weight training. Patient states the Zoloft has been helping. He feels he has good social support at home from his wife and his friends. He denies feeling hopeless or loss of interest in activities in the past two weeks. He denies any suicidal ideations.  He states his blood pressure at home has been 140/83 and will continue checking.  Otherwise, he has no other health complaints. He reports a balanced diet and stays hydrated. He states he has daily bowel movements. He denies any increased urinary frequency, urgency, or hesitancy.   He denies any tobacco or illicit drug use. He does drink 1-2 16 oz of 6% craft beer nightly.   He states his last eye exam was about a year ago, he will schedule for an appointment in the future. He had a colonoscopy in February 2018 and had 3 benign polyps removed from the transverse colon. He also went to urology in January 2018 and states he had a PSA level of 1.0. He will try to obtain the reports from Urology for [place].   Review of Systems  Constitutional: Negative for appetite change, chills, fatigue, fever and unexpected weight change.  HENT: Negative for congestion, ear pain, hearing loss, mouth sores, sinus pain, sinus pressure, sneezing, sore throat, tinnitus and trouble swallowing.   Eyes: Negative for visual disturbance.  Respiratory: Negative for cough, chest tightness and shortness of breath.   Cardiovascular: Negative for chest pain, palpitations and leg swelling.  Gastrointestinal: Negative for abdominal pain, blood in stool, constipation, diarrhea, nausea and vomiting.  Endocrine: Negative for cold intolerance, heat intolerance and polyuria.  Genitourinary: Negative for difficulty  urinating, dysuria, hematuria and urgency.  Musculoskeletal: Negative for joint swelling and myalgias.  Neurological: Negative for dizziness, light-headedness, numbness and headaches.  Psychiatric/Behavioral: Negative for sleep disturbance and suicidal ideas. The patient is nervous/anxious.    Prior to Admission medications   Medication Sig Start Date End Date Taking? Authorizing Provider  DESONIDE EX Apply topically.   Yes [provider]  ezetimibe (ZETIA) 10 MG tablet TAKE 1 TABLET BY MOUTH DAILY. 06/22/17  Yes Jeffery, Chelle, PA-C  rosuvastatin (CRESTOR) 40 MG tablet Take 1 tablet (40 mg total) by mouth daily. 06/24/16  Yes Jeffery, Chelle, PA-C  sertraline (ZOLOFT) 25 MG tablet TAKE 1 TABLET (25 MG TOTAL) BY MOUTH DAILY. 06/24/16  Yes Jeffery, Chelle, PA-C  triamcinolone cream (KENALOG) 0.1 % Apply topically daily.   Yes [provider]   Allergies  Allergen Reactions  . Rose Hips [Ascorbate]   . Tequin Rash   Social History   Social History  . Marital status: Married    Spouse name: Erline Levine  . Number of children: 2  . Years of education: college   Occupational History  . 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 Smoking status: Never Smoker  . Smokeless tobacco: Never Used     Comment: 20 yrs ago   . Alcohol use 8.4 oz/week    14 Cans of beer per week  . Drug use: No  . Sexual activity: Yes    Partners: Female   Other Topics Concern  . Not on file  Social History Narrative   Lives with his wife and their 2 children.     His wife is an Therapist, sports.     Their son has significant anxiety.   He exercises by running 3-5 miles 4x/week.      Objective:   Physical Exam  Constitutional: He is oriented to person, place, and time. He appears well-developed and well-nourished.  HENT:  Head: Normocephalic.  Left Ear: External ear normal.  Eyes: Pupils are equal, round, and reactive to light.  Cardiovascular: Normal rate, regular rhythm,  normal heart sounds and intact distal pulses.   Pulmonary/Chest: Effort normal and breath sounds normal.  Abdominal: Soft. Bowel sounds are normal. He exhibits no distension and no mass. There is no tenderness. There is no rebound and no guarding.  Neurological: He is alert and oriented to person, place, and time. He has normal reflexes.  Skin: Skin is warm and dry.  Psychiatric: He has a normal mood and affect. His behavior is normal. Judgment and thought content normal.      Assessment & Plan:  1. Need for prophylactic vaccination and inoculation against influenza - Flu Vaccine QUAD 36+ mos IM  2. Lipid disorder - Lipid panel to evaluate LDL levels  3. Generalized anxiety disorder - Continue on current Zoloft treatment  4. Elevated blood-pressure reading without diagnosis of hypertension - [place] of [place] hypertension - Instructed patient to keep monitoring blood pressures at home   Respectfully, Denny Levy PA-S 2019

## 2017-07-24 NOTE — Progress Notes (Signed)
Patient ID: Timothy Hoffman, male    DOB: 11-30-1960, 56 y.o.   MRN: [id number]  PCP: Timothy Mons, PA-C  Chief Complaint  Patient presents with  . Medication Refill    Zetia and Crestor     Subjective:   Presents for evaluation of hyperlipidemia.  He's had a colonoscopy. 3 benign colon polyps, repeat in 3-5 years. Saw urology in January, PSA was reportedly 1, and he was to follow-up in August, but didn't realize that.  Recent life stress. 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 He's really struggled with feeling angry, sad, embarrassed.   Tolerating his medications well without adverse effects. Doesn't think he needs to increase sertraline dose.   Review of Systems No chest pain, SOB, HA, dizziness, vision change, N/V, diarrhea, constipation, dysuria, urinary urgency or frequency, myalgias, arthralgias or rash.   Patient Active Problem List   Diagnosis Date Noted  . PSA elevation 12/15/2016  . Lipid disorder 07/13/2014  . Elevated blood-pressure reading without diagnosis of hypertension 07/13/2014  . Generalized anxiety disorder 06/02/2013     Prior to Admission medications   Medication Sig Start Date End Date Taking? Authorizing Provider  DESONIDE EX Apply topically.   Yes [provider]  ezetimibe (ZETIA) 10 MG tablet TAKE 1 TABLET BY MOUTH DAILY. 06/22/17  Yes Quenten Nawaz, PA-C  rosuvastatin (CRESTOR) 40 MG tablet Take 1 tablet (40 mg total) by mouth daily. 06/24/16  Yes Ellison Leisure, PA-C  sertraline (ZOLOFT) 25 MG tablet TAKE 1 TABLET (25 MG TOTAL) BY MOUTH DAILY. 06/24/16  Yes Toshiko Kemler, PA-C  triamcinolone cream (KENALOG) 0.1 % Apply topically daily.   Yes [provider]     Allergies  Allergen Reactions  . Rose Hips [Ascorbate]   . Tequin Rash       Objective:  Physical  Exam  Constitutional: He is oriented to person, place, and time. He appears well-developed and well-nourished. He is active and cooperative. No distress.  BP (!) 163/101   Pulse 86   Temp 98.2 F (36.8 C) (Oral)   Resp 18   Ht 6' (1.829 m)   Wt 186 lb (84.4 kg)   SpO2 97%   BMI 25.23 kg/m   HENT:  Head: Normocephalic and atraumatic.  Right Ear: Hearing normal.  Left Ear: Hearing normal.  Eyes: Conjunctivae are normal. No scleral icterus.  Neck: Normal range of motion. Neck supple. No thyromegaly present.  Cardiovascular: Normal rate, regular rhythm and normal heart sounds.   Pulses:      Radial pulses are 2+ on the right side, and 2+ on the left side.  Pulmonary/Chest: Effort normal and breath sounds normal.  Lymphadenopathy:       Head (right side): No tonsillar, no preauricular, no posterior auricular and no occipital adenopathy present.       Head (left side): No tonsillar, no preauricular, no posterior auricular and no occipital adenopathy present.    He has no cervical adenopathy.       Right: No supraclavicular adenopathy present.       Left: No supraclavicular adenopathy present.  Neurological: He is alert and oriented to person, place, and time. No sensory deficit.  Skin: Skin is warm, dry and intact. No rash noted. No cyanosis or erythema. Nails show no clubbing.  Psychiatric: He has a normal mood and affect. His  speech is normal and behavior is normal.           Assessment & Plan:   Problem List Items Addressed This Visit    Generalized anxiety disorder (Chronic)    Stable. Recent increased stress, but believes that the low-dose sertraline remains the right dose for him. Continue.      Relevant Orders   TSH (Completed)   Lipid disorder - Primary    Await labs. Adjust regimen as indicated by results. Continue rosuvastatin 40 mg and Zetia 10 mg.      Relevant Medications   rosuvastatin (CRESTOR) 40 MG tablet   ezetimibe (ZETIA) 10 MG tablet   Other  Relevant Orders   Comprehensive metabolic panel (Completed)   Lipid panel (Completed)   Elevated blood-pressure reading without diagnosis of hypertension    He'll ask his wife, an Therapist, sports, to check his BP twice a week and record the results. If he's consistently >140/90, plan to start antihypertensive medication.      Relevant Orders   CBC with Differential/Platelet (Completed)   Comprehensive metabolic panel (Completed)   TSH (Completed)   POCT urinalysis dipstick (Completed)   PSA elevation    Update lab. Will forward to urology.      Relevant Orders   PSA (Completed)    Other Visit Diagnoses    Need for prophylactic vaccination and inoculation against influenza       Relevant Orders   Flu Vaccine QUAD 36+ mos IM (Completed)       Return in about 1 year (around 07/24/2018), or if symptoms worsen or fail to improve, for re-evaluation of cholesterol, anxiety.   Fara Chute, PA-C [facility] [place]

## 2017-07-25 LAB — LIPID PANEL
CHOLESTEROL TOTAL: 212 mg/dL — AB (ref 100–199)
Chol/HDL Ratio: 3.2 ratio (ref 0.0–5.0)
HDL: 66 mg/dL (ref >39)
LDL CALC: 105 mg/dL — AB (ref 0–99)
Triglycerides: 206 mg/dL — ABNORMAL HIGH (ref 0–149)
VLDL CHOLESTEROL CAL: 41 mg/dL — AB (ref 5–40)

## 2017-07-25 LAB — CBC WITH DIFFERENTIAL/PLATELET
BASOS ABS: 0 10*3/uL (ref 0.0–0.2)
Basos: 0 %
EOS (ABSOLUTE): 0 10*3/uL (ref 0.0–0.4)
Eos: 1 %
HEMOGLOBIN: 12.5 g/dL — AB (ref 13.0–17.7)
Hematocrit: 38.1 % (ref 37.5–51.0)
Immature Grans (Abs): 0 10*3/uL (ref 0.0–0.1)
Immature Granulocytes: 0 %
LYMPHS ABS: 1 10*3/uL (ref 0.7–3.1)
Lymphs: 20 %
MCH: 28.3 pg (ref 26.6–33.0)
MCHC: 32.8 g/dL (ref 31.5–35.7)
MCV: 86 fL (ref 79–97)
MONOCYTES: 13 %
MONOS ABS: 0.7 10*3/uL (ref 0.1–0.9)
NEUTROS ABS: 3.3 10*3/uL (ref 1.4–7.0)
Neutrophils: 66 %
Platelets: 278 10*3/uL (ref 150–379)
RBC: 4.41 x10E6/uL (ref 4.14–5.80)
RDW: 14.6 % (ref 12.3–15.4)
WBC: 5.1 10*3/uL (ref 3.4–10.8)

## 2017-07-25 LAB — COMPREHENSIVE METABOLIC PANEL
ALK PHOS: 61 IU/L (ref 39–117)
ALT: 23 IU/L (ref 0–44)
AST: 24 IU/L (ref 0–40)
Albumin/Globulin Ratio: 1.9 (ref 1.2–2.2)
Albumin: 4.7 g/dL (ref 3.5–5.5)
BUN / CREAT RATIO: 30 — AB (ref 9–20)
BUN: 26 mg/dL — ABNORMAL HIGH (ref 6–24)
Bilirubin Total: 0.5 mg/dL (ref 0.0–1.2)
CALCIUM: 9.7 mg/dL (ref 8.7–10.2)
CO2: 23 mmol/L (ref 20–29)
CREATININE: 0.86 mg/dL (ref 0.76–1.27)
Chloride: 103 mmol/L (ref 96–106)
GFR calc Af Amer: 111 mL/min/{1.73_m2} (ref >59)
GFR, EST NON AFRICAN AMERICAN: 96 mL/min/{1.73_m2} (ref >59)
GLOBULIN, TOTAL: 2.5 g/dL (ref 1.5–4.5)
Glucose: 85 mg/dL (ref 65–99)
Potassium: 4.5 mmol/L (ref 3.5–5.2)
SODIUM: 140 mmol/L (ref 134–144)
Total Protein: 7.2 g/dL (ref 6.0–8.5)

## 2017-07-25 LAB — TSH: TSH: 1.45 u[IU]/mL (ref 0.450–4.500)

## 2017-07-25 LAB — PSA: PROSTATE SPECIFIC AG, SERUM: 1.9 ng/mL (ref 0.0–4.0)

## 2017-07-30 ENCOUNTER — Other Ambulatory Visit: Payer: Self-pay | Admitting: Physician Assistant

## 2017-07-30 DIAGNOSIS — F411 Generalized anxiety disorder: Secondary | ICD-10-CM

## 2017-07-30 MED FILL — SERTRALINE HCL 25 MG TABLET: 25 | 90 days supply | Qty: 90 | Fill #0

## 2017-07-30 NOTE — Assessment & Plan Note (Signed)
Await labs. Adjust regimen as indicated by results. Continue rosuvastatin 40 mg and Zetia 10 mg.

## 2017-07-30 NOTE — Assessment & Plan Note (Signed)
Stable. Recent increased stress, but believes that the low-dose sertraline remains the right dose for him. Continue.

## 2017-07-30 NOTE — Assessment & Plan Note (Signed)
Update lab. Will forward to urology.

## 2017-07-30 NOTE — Assessment & Plan Note (Signed)
He'll ask his wife, an Therapist, sports, to check his BP twice a week and record the results. If he's consistently >140/90, plan to start antihypertensive medication.

## 2017-10-21 MED FILL — ROSUVASTATIN CALCIUM 40 MG: 40 | 90 days supply | Qty: 90 | Fill #1

## 2017-10-21 MED FILL — EZETIMIBE 10 MG TABS: 10 | 90 days supply | Qty: 90 | Fill #1

## 2017-11-06 MED FILL — SERTRALINE HCL 25 MG TABLET: 25 | 90 days supply | Qty: 90 | Fill #1

## 2018-01-22 MED FILL — ROSUVASTATIN CALCIUM 40 MG: 40 | 90 days supply | Qty: 90 | Fill #2

## 2018-01-22 MED FILL — EZETIMIBE 10 MG TABS: 10 | 90 days supply | Qty: 90 | Fill #2

## 2018-02-08 ENCOUNTER — Encounter: Payer: Self-pay | Admitting: Physician Assistant

## 2018-02-09 ENCOUNTER — Other Ambulatory Visit: Payer: Self-pay | Admitting: Physician Assistant

## 2018-02-09 DIAGNOSIS — F411 Generalized anxiety disorder: Secondary | ICD-10-CM

## 2018-02-09 NOTE — Telephone Encounter (Signed)
Will route to office; provider note states for pt to return in 1 year (around 07/24/18); no upcoming office visits noted

## 2018-02-09 NOTE — Telephone Encounter (Signed)
Patient is requesting a refill of the following medications: Requested Prescriptions   Pending Prescriptions Disp Refills  . sertraline (ZOLOFT) 25 MG tablet [Pharmacy Med Name: SERTRALINE HCL 25 MG TABLET 25 TAB] 90 tablet 1    Sig: TAKE 1 TABLET BY MOUTH DAILY.    Date of patient request: 02/09/18 Last office visit: 07/24/17 Date of last refill: 07/30/17 Last refill amount: #90 1rf Follow up time period per chart: 1 YEAR

## 2018-02-10 NOTE — Telephone Encounter (Signed)
Pt is out of medication, he needs to have this filled

## 2018-02-10 NOTE — Telephone Encounter (Signed)
Rx sent electronically.  Meds ordered this encounter  Medications  . sertraline (ZOLOFT) 25 MG tablet    Sig: TAKE 1 TABLET BY MOUTH DAILY.    Dispense:  90 tablet    Refill:  1

## 2018-02-11 MED FILL — SERTRALINE HCL 25 MG TABLET: 25 | 90 days supply | Qty: 90 | Fill #0

## 2018-04-23 MED FILL — ROSUVASTATIN CALCIUM 40 MG: 40 | 90 days supply | Qty: 90 | Fill #3

## 2018-04-23 MED FILL — EZETIMIBE 10 MG TABLET: 10 | 90 days supply | Qty: 90 | Fill #3

## 2018-05-19 MED FILL — SERTRALINE HCL 25 MG TABLET: 25 | 90 days supply | Qty: 90 | Fill #1

## 2018-07-28 ENCOUNTER — Telehealth: Payer: Self-pay | Admitting: Physician Assistant

## 2018-07-28 NOTE — Telephone Encounter (Signed)
Pt came by to make an appt but he is out of his meds,particulary his cholesterol  One, he was wondering if we could call him enough to last until his appt   Rx for Zetia and Sertraline

## 2018-08-04 ENCOUNTER — Other Ambulatory Visit: Payer: Self-pay | Admitting: *Deleted

## 2018-08-04 DIAGNOSIS — F411 Generalized anxiety disorder: Secondary | ICD-10-CM

## 2018-08-04 DIAGNOSIS — E789 Disorder of lipoprotein metabolism, unspecified: Secondary | ICD-10-CM

## 2018-08-04 MED ORDER — EZETIMIBE 10 MG PO TABS: 10.0000 mg | ORAL_TABLET | Freq: Every day | ORAL | 0 refills | Status: DC

## 2018-08-04 MED ORDER — SERTRALINE HCL 25 MG PO TABS
25.0000 mg | ORAL_TABLET | Freq: Every day | ORAL | 0 refills | Status: DC
Start: 2018-08-04 — End: 2018-09-06

## 2018-08-04 MED FILL — EZETIMIBE 10 MG TABLET: 10 | 15 days supply | Qty: 15 | Fill #0

## 2018-08-04 MED FILL — SERTRALINE HCL 25 MG TABLET: 25 | 15 days supply | Qty: 15 | Fill #0

## 2018-08-13 ENCOUNTER — Ambulatory Visit: Payer: 59 | Admitting: Physician Assistant

## 2018-09-06 ENCOUNTER — Encounter: Payer: Self-pay | Admitting: Family Medicine

## 2018-09-06 ENCOUNTER — Ambulatory Visit (INDEPENDENT_AMBULATORY_CARE_PROVIDER_SITE_OTHER): Payer: No Typology Code available for payment source | Admitting: Family Medicine

## 2018-09-06 VITALS — BP 150/100 | HR 93 | Temp 98.3°F | Resp 16 | Ht 70.0 in | Wt 181.0 lb

## 2018-09-06 DIAGNOSIS — D649 Anemia, unspecified: Secondary | ICD-10-CM

## 2018-09-06 DIAGNOSIS — Z23 Encounter for immunization: Secondary | ICD-10-CM

## 2018-09-06 DIAGNOSIS — R030 Elevated blood-pressure reading, without diagnosis of hypertension: Secondary | ICD-10-CM

## 2018-09-06 DIAGNOSIS — Z860100 Personal history of colon polyps, unspecified: Secondary | ICD-10-CM

## 2018-09-06 DIAGNOSIS — Z0000 Encounter for general adult medical examination without abnormal findings: Secondary | ICD-10-CM

## 2018-09-06 DIAGNOSIS — E789 Disorder of lipoprotein metabolism, unspecified: Secondary | ICD-10-CM

## 2018-09-06 DIAGNOSIS — F411 Generalized anxiety disorder: Secondary | ICD-10-CM

## 2018-09-06 DIAGNOSIS — R972 Elevated prostate specific antigen [PSA]: Secondary | ICD-10-CM

## 2018-09-06 DIAGNOSIS — L309 Dermatitis, unspecified: Secondary | ICD-10-CM

## 2018-09-06 LAB — COMPREHENSIVE METABOLIC PANEL WITH GFR
ALT: 13 U/L (ref 0–53)
AST: 21 U/L (ref 0–37)
Albumin: 4.4 g/dL (ref 3.5–5.2)
Alkaline Phosphatase: 67 U/L (ref 39–117)
BUN: 16 mg/dL (ref 6–23)
CO2: 25 meq/L (ref 19–32)
Calcium: 9.7 mg/dL (ref 8.4–10.5)
Chloride: 103 meq/L (ref 96–112)
Creatinine, Ser: 0.81 mg/dL (ref 0.40–1.50)
GFR: 103.72 mL/min
Glucose, Bld: 97 mg/dL (ref 70–99)
Potassium: 4.5 meq/L (ref 3.5–5.1)
Sodium: 137 meq/L (ref 135–145)
Total Bilirubin: 0.4 mg/dL (ref 0.2–1.2)
Total Protein: 7.4 g/dL (ref 6.0–8.3)

## 2018-09-06 LAB — CBC
HEMATOCRIT: 30.4 % — AB (ref 39.0–52.0)
HEMOGLOBIN: 9.6 g/dL — AB (ref 13.0–17.0)
MCHC: 31.7 g/dL (ref 30.0–36.0)
Platelets: 364 10*3/uL (ref 150.0–400.0)
RBC: 4.4 Mil/uL (ref 4.22–5.81)
RDW: 19 % — AB (ref 11.5–15.5)
WBC: 3.1 10*3/uL — ABNORMAL LOW (ref 4.0–10.5)

## 2018-09-06 LAB — LIPID PANEL
CHOL/HDL RATIO: 5
CHOLESTEROL: 295 mg/dL — AB (ref 0–200)
HDL: 55.7 mg/dL (ref >39.00)

## 2018-09-06 LAB — TSH: TSH: 2.63 u[IU]/mL (ref 0.35–4.50)

## 2018-09-06 LAB — PSA: PSA: 2.24 ng/mL (ref 0.10–4.00)

## 2018-09-06 LAB — LDL CHOLESTEROL, DIRECT: Direct LDL: 113 mg/dL

## 2018-09-06 MED ORDER — TRIAMCINOLONE ACETONIDE 0.1 % EX CREA: TOPICAL_CREAM | Freq: Every day | CUTANEOUS | 1 refills | Status: DC

## 2018-09-06 MED ORDER — SERTRALINE HCL 25 MG PO TABS: 25.0000 mg | ORAL_TABLET | Freq: Every day | ORAL | 1 refills | Status: DC

## 2018-09-06 MED FILL — TRIAMCINOLONE 0.1% CREAM: 0.1 | 30 days supply | Qty: 80 | Fill #0

## 2018-09-06 MED FILL — SERTRALINE HCL 25 MG TABLET: 25 | 90 days supply | Qty: 90 | Fill #0

## 2018-09-06 NOTE — Assessment & Plan Note (Signed)
Patient encouraged to maintain heart healthy diet, regular exercise, adequate sleep. Consider daily probiotics. Take medications as prescribed 

## 2018-09-06 NOTE — Progress Notes (Signed)
Subjective:    Patient ID: Timothy Hoffman, male    DOB: 1961-06-11, 57 y.o.   MRN: [id number]  Chief Complaint  Patient presents with  . New Patient (Initial Visit)    HPI Patient is in today for new patie .sbhnt appointment.  He reports feeling well at the present time but has recently undergone colonoscopy which revealed polyps and internal hemorrhoids.  He had some bleeding with hemorrhoids and underwent banding.  He reports he is much improved symptomatically since that time.  He reports he has anxiety disorder but it is adequately treated on low-dose sertraline.  No recent febrile illness or hospitalization.  He maintains a heart healthy diet and exercises regularly.  Eats a vegan diet.  He has some dry cracked skin on his hands which is recurred recently. Denies CP/palp/SOB/HA/congestion/fevers/GI or GU c/o. Taking meds as prescribed  Past Medical History:  Diagnosis Date  . Allergy    SEASONAL  . Anemia   . Anxiety   . Arrhythmia 1999  . Atopic dermatitis   . Hyperlipemia   . Internal hemorrhoid, bleeding 2013    Past Surgical History:  Procedure Laterality Date  . KNEE ARTHROSCOPY  2012  . Right shoulder scope      Family History  Problem Relation Age of Onset  . Lymphoma Father   . Cancer Father        nonHodgkin's lymphoma  . Hyperlipidemia Mother   . Hypertension Mother   . Hypertension Brother   . Hyperlipidemia Brother   . Mental illness Son        Anxiety  . Anxiety disorder Son   . Stroke Maternal Grandmother   . Colon cancer Neg Hx     Social History   Socioeconomic History  . Marital status: Married    Spouse name: Erline Levine  . Number of children: 2  . Years of education: college  . Highest education level: Not on file  Occupational History  . Occupation: Automotive engineer: [employer]  Social Needs  . Financial resource strain: Not hard at all  . Food insecurity:    Worry: Never true    Inability: Never true  . Transportation  needs:    Medical: No    Non-medical: No  Tobacco Use  . Smoking status: Never Smoker  . Smokeless tobacco: Never Used  . Tobacco comment: 20 yrs ago   Substance and Sexual Activity  . Alcohol use: Yes    Alcohol/week: 2.0 standard drinks    Types: 2 Cans of beer per week  . Drug use: No  . Sexual activity: Yes    Partners: Female  Lifestyle  . Physical activity:    Days per week: 6 days    Minutes per session: 50 min  . Stress: Not on file  Relationships  . Social connections:    Talks on phone: More than three times a week    Gets together: Three times a week    Attends religious service: Never    Active member of club or organization: No    Attends meetings of clubs or organizations: Never    Relationship status: Married  . Intimate partner violence:    Fear of current or ex partner: No    Emotionally abused: No    Physically abused: No    Forced sexual activity: No  Other Topics Concern  . Not on file  Social History Narrative   Lives with his wife and their 2  children.     His wife is an Therapist, sports.     Their son has significant anxiety.   He exercises by running 3-5 miles 4x/week.      Vegan    Outpatient Medications Prior to Visit  Medication Sig Dispense Refill  . ezetimibe (ZETIA) 10 MG tablet Take 1 tablet (10 mg total) by mouth daily. 15 tablet 0  . rosuvastatin (CRESTOR) 40 MG tablet Take 1 tablet (40 mg total) by mouth daily. 90 tablet 3  . DESONIDE EX Apply topically.    . sertraline (ZOLOFT) 25 MG tablet Take 1 tablet (25 mg total) by mouth daily. 15 tablet 0  . triamcinolone cream (KENALOG) 0.1 % Apply topically daily. 30 g 0  . 0.9 %  sodium chloride infusion      No facility-administered medications prior to visit.     Allergies  Allergen Reactions  . Rose Hips [Ascorbate]   . Tequin Rash    Review of Systems  Constitutional: Negative for chills, fever and malaise/fatigue.  HENT: Negative for congestion and hearing loss.   Eyes: Negative for  discharge.  Respiratory: Negative for cough, sputum production and shortness of breath.   Cardiovascular: Negative for chest pain, palpitations and leg swelling.  Gastrointestinal: Negative for abdominal pain, blood in stool, constipation, diarrhea, heartburn, nausea and vomiting.  Genitourinary: Negative for dysuria, frequency, hematuria and urgency.  Musculoskeletal: Negative for back pain, falls and myalgias.  Skin: Positive for rash.  Neurological: Negative for dizziness, sensory change, loss of consciousness, weakness and headaches.  Endo/Heme/Allergies: Negative for environmental allergies. Does not bruise/bleed easily.  Psychiatric/Behavioral: Negative for depression and suicidal ideas. The patient is nervous/anxious. The patient does not have insomnia.        Objective:    Physical Exam  Constitutional: He is oriented to person, place, and time. He appears well-developed and well-nourished. No distress.  HENT:  Head: Normocephalic and atraumatic.  Right Ear: External ear normal.  Left Ear: External ear normal.  Nose: Nose normal.  Mouth/Throat: Oropharynx is clear and moist.  Eyes: Pupils are equal, round, and reactive to light. Conjunctivae and EOM are normal. Right eye exhibits no discharge. Left eye exhibits no discharge.  Neck: Normal range of motion. Neck supple. No thyromegaly present.  Cardiovascular: Normal rate and regular rhythm.  No murmur heard. Pulmonary/Chest: Effort normal and breath sounds normal.  Abdominal: Soft. Bowel sounds are normal. He exhibits no distension and no mass. There is no tenderness. There is no rebound and no guarding.  Musculoskeletal: He exhibits no edema, tenderness or deformity.  Lymphadenopathy:    He has no cervical adenopathy.  Neurological: He is alert and oriented to person, place, and time. He displays normal reflexes. No cranial nerve deficit or sensory deficit. He exhibits normal muscle tone. Coordination normal.  Skin: Skin is  warm and dry. Rash noted. No erythema.  Dry cracking skin on fingers  Psychiatric: He has a normal mood and affect.  Nursing note and vitals reviewed.   BP (!) 150/100 (BP Location: Right Arm, Patient Position: Sitting, Cuff Size: Small)   Pulse 93   Temp 98.3 F (36.8 C) (Oral)   Resp 16   Ht 5\' 10"  (1.778 m)   Wt 181 lb (82.1 kg)   SpO2 99%   BMI 25.97 kg/m  Wt Readings from Last 3 Encounters:  09/06/18 181 lb (82.1 kg)  07/24/17 186 lb (84.4 kg)  12/22/16 186 lb (84.4 kg)     Lab Results  Component  Value Date   WBC 3.1 (L) 09/06/2018   HGB 9.6 (L) 09/06/2018   HCT 30.4 (L) 09/06/2018   PLT 364.0 09/06/2018   GLUCOSE 97 09/06/2018   CHOL 295 (H) 09/06/2018   TRIG (H) 09/06/2018    581.0 Triglyceride is over 400; calculations on Lipids are invalid.   HDL 55.70 09/06/2018   LDLDIRECT 113.0 09/06/2018   LDLCALC 105 (H) 07/24/2017   ALT 13 09/06/2018   AST 21 09/06/2018   NA 137 09/06/2018   K 4.5 09/06/2018   CL 103 09/06/2018   CREATININE 0.81 09/06/2018   BUN 16 09/06/2018   CO2 25 09/06/2018   TSH 2.63 09/06/2018   PSA 2.24 09/06/2018    Lab Results  Component Value Date   TSH 2.63 09/06/2018   Lab Results  Component Value Date   WBC 3.1 (L) 09/06/2018   HGB 9.6 (L) 09/06/2018   HCT 30.4 (L) 09/06/2018   MCV 69.1 Repeated and verified X2. (L) 09/06/2018   PLT 364.0 09/06/2018   Lab Results  Component Value Date   NA 137 09/06/2018   K 4.5 09/06/2018   CO2 25 09/06/2018   GLUCOSE 97 09/06/2018   BUN 16 09/06/2018   CREATININE 0.81 09/06/2018   BILITOT 0.4 09/06/2018   ALKPHOS 67 09/06/2018   AST 21 09/06/2018   ALT 13 09/06/2018   PROT 7.4 09/06/2018   ALBUMIN 4.4 09/06/2018   CALCIUM 9.7 09/06/2018   GFR 103.72 09/06/2018   Lab Results  Component Value Date   CHOL 295 (H) 09/06/2018   Lab Results  Component Value Date   HDL 55.70 09/06/2018   Lab Results  Component Value Date   LDLCALC 105 (H) 07/24/2017   Lab Results    Component Value Date   TRIG (H) 09/06/2018    581.0 Triglyceride is over 400; calculations on Lipids are invalid.   Lab Results  Component Value Date   CHOLHDL 5 09/06/2018   No results found for: HGBA1C     Assessment & Plan:   Problem List Items Addressed This Visit    Generalized anxiety disorder (Chronic)    Doing well on low dose Sertraline       Relevant Medications   sertraline (ZOLOFT) 25 MG tablet   Lipid disorder    Tolerating statin, encouraged heart healthy diet, avoid trans fats, minimize simple carbs and saturated fats. Increase exercise as tolerated. Was out of Crestor and Zetia for a short time. Check labs      Relevant Orders   Lipid panel (Completed)   Elevated blood-pressure reading without diagnosis of hypertension    He reports his numbers at home are often int he 130s over 80s. He will repot if they run higher between visits. Maintain heart healthy diet such as the DASH diet and exercise as tolerated.       PSA elevation    Check PSA today      Relevant Orders   PSA (Completed)   History of colon polyps    Next colonoscopy due 2021. Last one 2018 with polyps, internal hemorrhoids, diverticli      Preventative health care    Patient encouraged to maintain heart healthy diet, regular exercise, adequate sleep. Consider daily probiotics. Take medications as prescribed.       Relevant Orders   CBC (Completed)   Comprehensive metabolic panel (Completed)   TSH (Completed)   Anemia    Worsening after internal hemorhoids banded. Check IFob and start hemocyte F repeat  CBC next month      Dermatitis    Hands. Triamcinolone cream prn       Other Visit Diagnoses    Needs flu shot    -  Primary   Relevant Orders   Flu Vaccine QUAD 36+ mos IM (Fluarix & Fluzone Quad PF (Completed)      I have discontinued Dacen H. Ryther's DESONIDE EX. I am also having him maintain his rosuvastatin, ezetimibe, sertraline, and triamcinolone cream. We will stop  administering sodium chloride.  Meds ordered this encounter  Medications  . sertraline (ZOLOFT) 25 MG tablet    Sig: Take 1 tablet (25 mg total) by mouth daily.    Dispense:  90 tablet    Refill:  1  . triamcinolone cream (KENALOG) 0.1 %    Sig: Apply topically daily.    Dispense:  80 g    Refill:  1     Penni Homans, MD

## 2018-09-06 NOTE — Assessment & Plan Note (Signed)
Worsening after internal hemorhoids banded. Check IFob and start hemocyte F repeat CBC next month

## 2018-09-06 NOTE — Assessment & Plan Note (Signed)
Check PSA today. 

## 2018-09-06 NOTE — Assessment & Plan Note (Signed)
Hands. Triamcinolone cream prn

## 2018-09-06 NOTE — Assessment & Plan Note (Signed)
Next colonoscopy due 2021. Last one 2018 with polyps, internal hemorrhoids, diverticli

## 2018-09-06 NOTE — Assessment & Plan Note (Signed)
Tolerating statin, encouraged heart healthy diet, avoid trans fats, minimize simple carbs and saturated fats. Increase exercise as tolerated. Was out of Crestor and Zetia for a short time. Check labs

## 2018-09-06 NOTE — Assessment & Plan Note (Signed)
Doing well on low dose Sertraline

## 2018-09-06 NOTE — Assessment & Plan Note (Signed)
He reports his numbers at home are often int he 130s over 80s. He will repot if they run higher between visits. Maintain heart healthy diet such as the DASH diet and exercise as tolerated.

## 2018-09-06 NOTE — Patient Instructions (Addendum)
100-140/60-90  Shingrix is the new shingles shot 2 shots over 2-6 months can call for nurse appointment Preventive Care 40-64 Years, Male Preventive care refers to lifestyle choices and visits with your health care provider that can promote health and wellness. What does preventive care include?  A yearly physical exam. This is also called an annual well check.  Dental exams once or twice a year.  Routine eye exams. Ask your health care provider how often you should have your eyes checked.  Personal lifestyle choices, including: ? Daily care of your teeth and gums. ? Regular physical activity. ? Eating a healthy diet. ? Avoiding tobacco and drug use. ? Limiting alcohol use. ? Practicing safe sex. ? Taking low-dose aspirin every day starting at age 90. What happens during an annual well check? The services and screenings done by your health care provider during your annual well check will depend on your age, overall health, lifestyle risk factors, and family history of disease. Counseling Your health care provider may ask you questions about your:  Alcohol use.  Tobacco use.  Drug use.  Emotional well-being.  Home and relationship well-being.  Sexual activity.  Eating habits.  Work and work Statistician.  Screening You may have the following tests or measurements:  Height, weight, and BMI.  Blood pressure.  Lipid and cholesterol levels. These may be checked every 5 years, or more frequently if you are over 22 years old.  Skin check.  Lung cancer screening. You may have this screening every year starting at age 69 if you have a 30-pack-year history of smoking and currently smoke or have quit within the past 15 years.  Fecal occult blood test (FOBT) of the stool. You may have this test every year starting at age 38.  Flexible sigmoidoscopy or colonoscopy. You may have a sigmoidoscopy every 5 years or a colonoscopy every 10 years starting at age 8.  Prostate  cancer screening. Recommendations will vary depending on your family history and other risks.  Hepatitis C blood test.  Hepatitis B blood test.  Sexually transmitted disease (STD) testing.  Diabetes screening. This is done by checking your blood sugar (glucose) after you have not eaten for a while (fasting). You may have this done every 1-3 years.  Discuss your test results, treatment options, and if necessary, the need for more tests with your health care provider. Vaccines Your health care provider may recommend certain vaccines, such as:  Influenza vaccine. This is recommended every year.  Tetanus, diphtheria, and acellular pertussis (Tdap, Td) vaccine. You may need a Td booster every 10 years.  Varicella vaccine. You may need this if you have not been vaccinated.  Zoster vaccine. You may need this after age 65.  Measles, mumps, and rubella (MMR) vaccine. You may need at least one dose of MMR if you were born in 1957 or later. You may also need a second dose.  Pneumococcal 13-valent conjugate (PCV13) vaccine. You may need this if you have certain conditions and have not been vaccinated.  Pneumococcal polysaccharide (PPSV23) vaccine. You may need one or two doses if you smoke cigarettes or if you have certain conditions.  Meningococcal vaccine. You may need this if you have certain conditions.  Hepatitis A vaccine. You may need this if you have certain conditions or if you travel or work in places where you may be exposed to hepatitis A.  Hepatitis B vaccine. You may need this if you have certain conditions or if you travel or  work in places where you may be exposed to hepatitis B.  Haemophilus influenzae type b (Hib) vaccine. You may need this if you have certain risk factors.  Talk to your health care provider about which screenings and vaccines you need and how often you need them. This information is not intended to replace advice given to you by your health care provider.  Make sure you discuss any questions you have with your health care provider. Document Released: 11/16/2015 Document Revised: 07/09/2016 Document Reviewed: 08/21/2015 Elsevier Interactive Patient Education  [employer].

## 2018-09-07 ENCOUNTER — Encounter: Payer: Self-pay | Admitting: Family Medicine

## 2018-09-07 ENCOUNTER — Other Ambulatory Visit: Payer: Self-pay | Admitting: Physician Assistant

## 2018-09-07 DIAGNOSIS — E789 Disorder of lipoprotein metabolism, unspecified: Secondary | ICD-10-CM

## 2018-09-07 MED FILL — EZETIMIBE 10 MG TABLET: 10 | 90 days supply | Qty: 90 | Fill #0

## 2018-09-07 NOTE — Telephone Encounter (Signed)
Requested medication (s) are due for refill today -yes  Requested medication (s) are on the active medication list -yes  Future visit scheduled -yes  Last refill: 08/04/18  Notes to clinic: patient was seen yesterday- awaiting review of labs and order to continue medication.  Requested Prescriptions  Pending Prescriptions Disp Refills   ezetimibe (ZETIA) 10 MG tablet [Pharmacy Med Name: EZETIMIBE 10 MG TABLET 10 TAB] 15 tablet 0    Sig: TAKE 1 TABLET BY MOUTH DAILY.     Cardiovascular:  Antilipid - Sterol Transport Inhibitors Failed - 09/07/2018  3:45 PM      Failed - Total Cholesterol in normal range and within 360 days    Cholesterol, Total  Date Value Ref Range Status  07/24/2017 212 (H) 100 - 199 mg/dL Final   Cholesterol  Date Value Ref Range Status  09/06/2018 295 (H) 0 - 200 mg/dL Final    Comment:    ATP III Classification       Desirable:  < 200 mg/dL               Borderline High:  200 - 239 mg/dL          High:  > = 240 mg/dL         Failed - LDL in normal range and within 360 days    LDL Calculated  Date Value Ref Range Status  07/24/2017 105 (H) 0 - 99 mg/dL Final         Failed - Triglycerides in normal range and within 360 days    Triglycerides  Date Value Ref Range Status  09/06/2018 (H) 0.0 - 149.0 mg/dL Final   581.0 Triglyceride is over 400; calculations on Lipids are invalid.    Comment:    Normal:  <150 mg/dLBorderline High:  150 - 199 mg/dL         Passed - HDL in normal range and within 360 days    HDL  Date Value Ref Range Status  09/06/2018 55.70 >39.00 mg/dL Final  07/24/2017 66 >39 mg/dL Final         Passed - Valid encounter within last 12 months    Recent Outpatient Visits          Yesterday Needs flu shot   [facility] [place], [place]   1 year ago Lipid disorder   [facility] at [place], [place], [place]   2 years ago Annual physical exam   [facility] at [place], [place], [place]    3 years ago Generalized anxiety disorder   [facility] [place], [place] M[place], [place]   4 years ago Lipid disorder   [facility] [place], Lav[place] Neri, [place]      Future Appointments            In 6 months Mosie Lukes, MD [place] [facility], [facility]            Requested Prescriptions  Pending Prescriptions Disp Refills   ezetimibe (ZETIA) 10 MG tablet [Pharmacy Med Name: EZETIMIBE 10 MG TABLET 10 TAB] 15 tablet 0    Sig: TAKE 1 TABLET BY MOUTH DAILY.     Cardiovascular:  Antilipid - Sterol Transport Inhibitors Failed - 09/07/2018  3:45 PM      Failed - Total Cholesterol in normal range and within 360 days    Cholesterol, Total  Date Value Ref Range Status  07/24/2017 212 (  H) 100 - 199 mg/dL Final   Cholesterol  Date Value Ref Range Status  09/06/2018 295 (H) 0 - 200 mg/dL Final    Comment:    ATP III Classification       Desirable:  < 200 mg/dL               Borderline High:  200 - 239 mg/dL          High:  > = 240 mg/dL         Failed - LDL in normal range and within 360 days    LDL Calculated  Date Value Ref Range Status  07/24/2017 105 (H) 0 - 99 mg/dL Final         Failed - Triglycerides in normal range and within 360 days    Triglycerides  Date Value Ref Range Status  09/06/2018 (H) 0.0 - 149.0 mg/dL Final   581.0 Triglyceride is over 400; calculations on Lipids are invalid.    Comment:    Normal:  <150 mg/dLBorderline High:  150 - 199 mg/dL         Passed - HDL in normal range and within 360 days    HDL  Date Value Ref Range Status  09/06/2018 55.70 >39.00 mg/dL Final  07/24/2017 66 >39 mg/dL Final         Passed - Valid encounter within last 12 months    Recent Outpatient Visits          Yesterday Needs flu shot   [facility] at [place], [place]   1 year ago Lipid disorder   [facility] at [place], [place], [place]   2 years ago Annual physical exam   [facility] at [place], [place], [place]   3 years ago Generalized anxiety disorder   [facility], [place] Malling, [place]   4 years ago Lipid disorder   [facility] at [place], [place]      Future Appointments            In 6 months Mosie Lukes, MD [facility] [place]

## 2018-09-08 MED ORDER — FERROUS FUMARATE-FOLIC ACID 324-1 MG PO TABS: 1.0000 | ORAL_TABLET | Freq: Every day | ORAL | 3 refills | Status: DC

## 2018-09-08 MED FILL — HEMATINIC-FOLIC ACID TABLET: 324-1 | 30 days supply | Qty: 30 | Fill #0

## 2018-09-08 NOTE — Addendum Note (Signed)
Addended byDamita Dunnings D on: 09/08/2018 09:46 AM   Modules accepted: Orders

## 2018-09-09 MED ORDER — ROSUVASTATIN CALCIUM 40 MG PO TABS: 40.0000 mg | ORAL_TABLET | Freq: Every day | ORAL | 3 refills | Status: DC

## 2018-09-09 MED FILL — ROSUVASTATIN CALCIUM 40 MG: 40 | 90 days supply | Qty: 90 | Fill #0

## 2018-09-09 NOTE — Addendum Note (Signed)
Addended byDamita Dunnings D on: 09/09/2018 07:59 AM   Modules accepted: Orders

## 2018-12-08 MED FILL — EZETIMIBE 10 MG TABS: 10 | 90 days supply | Qty: 90 | Fill #1

## 2018-12-08 MED FILL — SERTRALINE HCL 25 MG TABLET: 25 | 90 days supply | Qty: 90 | Fill #1

## 2018-12-08 MED FILL — ROSUVASTATIN CALCIUM 40 MG: 40 | 90 days supply | Qty: 90 | Fill #1

## 2019-03-08 ENCOUNTER — Encounter: Payer: Self-pay | Admitting: Family Medicine

## 2019-03-08 ENCOUNTER — Other Ambulatory Visit: Payer: Self-pay | Admitting: Family Medicine

## 2019-03-08 DIAGNOSIS — F411 Generalized anxiety disorder: Secondary | ICD-10-CM

## 2019-03-08 MED FILL — EZETIMIBE 10 MG TABS: 10 | 90 days supply | Qty: 90 | Fill #0

## 2019-03-08 MED FILL — ROSUVASTATIN CALCIUM 40 MG: 40 | 90 days supply | Qty: 90 | Fill #0

## 2019-03-10 MED FILL — SERTRALINE HCL 25 MG TABLET: 25 | 90 days supply | Qty: 90 | Fill #0

## 2019-03-11 ENCOUNTER — Other Ambulatory Visit: Payer: Self-pay

## 2019-03-11 ENCOUNTER — Ambulatory Visit (INDEPENDENT_AMBULATORY_CARE_PROVIDER_SITE_OTHER): Payer: No Typology Code available for payment source | Admitting: Family Medicine

## 2019-03-11 DIAGNOSIS — E789 Disorder of lipoprotein metabolism, unspecified: Secondary | ICD-10-CM

## 2019-03-11 DIAGNOSIS — D649 Anemia, unspecified: Secondary | ICD-10-CM | POA: Diagnosis not present

## 2019-03-11 DIAGNOSIS — F411 Generalized anxiety disorder: Secondary | ICD-10-CM | POA: Diagnosis not present

## 2019-03-11 NOTE — Patient Instructions (Signed)
Vitamin C 1000 mg 3 x a day  Zinc 30-50 mg daily Deep breathing 5 x an hour hold for 5 seconds and release Pulse oximeter  Sodium bicarb/baking soda 1/4 tsp up to 6 x daily if sick

## 2019-03-13 NOTE — Assessment & Plan Note (Signed)
Continues to do well on Sertraline. Refills given

## 2019-03-13 NOTE — Progress Notes (Signed)
Virtual Visit via Video Note  I connected with [name] on 03/13/19 at  9:40 AM EDT by a video enabled telemedicine application and verified that I am speaking with the correct person using two identifiers.  Location: Patient: home Provider: home   I discussed the limitations of evaluation and management by telemedicine and the availability of in person appointments. The patient expressed understanding and agreed to proceed. Magdalene Molly, CMA was able to get the patient set up on a video visit.     Subjective:    Patient ID: Timothy Hoffman, male    DOB: 1961-04-29, 58 y.o.   MRN: [id number]  No chief complaint on file.   HPI Patient is in today for follow up on hyperlipidemia, anemia and anxiety disorder. No recent febrile illness or hospitalizations. No acute concerns. Denies CP/palp/SOB/HA/congestion/fevers/GI or GU c/o. Taking meds as prescribed  Past Medical History:  Diagnosis Date  . Allergy    SEASONAL  . Anemia   . Anxiety   . Arrhythmia 1999  . Atopic dermatitis   . Hyperlipemia   . Internal hemorrhoid, bleeding 2013    Past Surgical History:  Procedure Laterality Date  . KNEE ARTHROSCOPY  2012  . Right shoulder scope      Family History  Problem Relation Age of Onset  . Lymphoma Father   . Cancer Father        nonHodgkin's lymphoma  . Hyperlipidemia Mother   . Hypertension Mother   . Hypertension Brother   . Hyperlipidemia Brother   . Mental illness Son        Anxiety  . Anxiety disorder Son   . Stroke Maternal Grandmother   . Colon cancer Neg Hx     Social History   Socioeconomic History  . Marital status: Married    Spouse name: Erline Levine  . Number of children: 2  . Years of education: college  . Highest education level: Not on file  Occupational History  . Occupation: Automotive engineer: [employer]  Social Needs  . Financial resource strain: Not hard at all  . Food insecurity:    Worry: Never true    Inability: Never  true  . Transportation needs:    Medical: No    Non-medical: No  Tobacco Use  . Smoking status: Never Smoker  . Smokeless tobacco: Never Used  . Tobacco comment: 20 yrs ago   Substance and Sexual Activity  . Alcohol use: Yes    Alcohol/week: 2.0 standard drinks    Types: 2 Cans of beer per week  . Drug use: No  . Sexual activity: Yes    Partners: Female  Lifestyle  . Physical activity:    Days per week: 6 days    Minutes per session: 50 min  . Stress: Not on file  Relationships  . Social connections:    Talks on phone: More than three times a week    Gets together: Three times a week    Attends religious service: Never    Active member of club or organization: No    Attends meetings of clubs or organizations: Never    Relationship status: Married  . Intimate partner violence:    Fear of current or ex partner: No    Emotionally abused: No    Physically abused: No    Forced sexual activity: No  Other Topics Concern  . Not on file  Social History Narrative   Lives with his wife and  their 2 children.     His wife is an Therapist, sports.     Their son has significant anxiety.   He exercises by running 3-5 miles 4x/week.      Vegan    Outpatient Medications Prior to Visit  Medication Sig Dispense Refill  . ezetimibe (ZETIA) 10 MG tablet Take 1 tablet (10 mg total) by mouth daily. 90 tablet 3  . Ferrous Fumarate-Folic Acid (HEMOCYTE-F) 324-1 MG TABS Take 1 tablet by mouth daily. 30 each 3  . rosuvastatin (CRESTOR) 40 MG tablet Take 1 tablet (40 mg total) by mouth daily. 90 tablet 3  . sertraline (ZOLOFT) 25 MG tablet TAKE 1 TABLET BY MOUTH DAILY. 90 tablet 1  . triamcinolone cream (KENALOG) 0.1 % Apply topically daily. 80 g 1   No facility-administered medications prior to visit.     Allergies  Allergen Reactions  . Rose Hips [Ascorbate]   . Tequin Rash    Review of Systems  Constitutional: Negative for fever and malaise/fatigue.  HENT: Negative for congestion.   Eyes:  Negative for blurred vision.  Respiratory: Negative for shortness of breath.   Cardiovascular: Negative for chest pain, palpitations and leg swelling.  Gastrointestinal: Negative for abdominal pain, blood in stool and nausea.  Genitourinary: Negative for dysuria and frequency.  Musculoskeletal: Negative for falls.  Skin: Negative for rash.  Neurological: Negative for dizziness, loss of consciousness and headaches.  Endo/Heme/Allergies: Negative for environmental allergies.  Psychiatric/Behavioral: Negative for depression. The patient is not nervous/anxious.        Objective:    Physical Exam Constitutional:      Appearance: Normal appearance. He is not ill-appearing.  HENT:     Head: Normocephalic and atraumatic.     Nose: Nose normal.  Pulmonary:     Effort: Pulmonary effort is normal.  Neurological:     Mental Status: He is alert and oriented to person, place, and time.  Psychiatric:        Mood and Affect: Mood normal.        Behavior: Behavior normal.     There were no vitals taken for this visit. Wt Readings from Last 3 Encounters:  09/06/18 181 lb (82.1 kg)  07/24/17 186 lb (84.4 kg)  12/22/16 186 lb (84.4 kg)    Diabetic Foot Exam - Simple   No data filed     Lab Results  Component Value Date   WBC 3.1 (L) 09/06/2018   HGB 9.6 (L) 09/06/2018   HCT 30.4 (L) 09/06/2018   PLT 364.0 09/06/2018   GLUCOSE 97 09/06/2018   CHOL 295 (H) 09/06/2018   TRIG (H) 09/06/2018    581.0 Triglyceride is over 400; calculations on Lipids are invalid.   HDL 55.70 09/06/2018   LDLDIRECT 113.0 09/06/2018   LDLCALC 105 (H) 07/24/2017   ALT 13 09/06/2018   AST 21 09/06/2018   NA 137 09/06/2018   K 4.5 09/06/2018   CL 103 09/06/2018   CREATININE 0.81 09/06/2018   BUN 16 09/06/2018   CO2 25 09/06/2018   TSH 2.63 09/06/2018   PSA 2.24 09/06/2018    Lab Results  Component Value Date   TSH 2.63 09/06/2018   Lab Results  Component Value Date   WBC 3.1 (L) 09/06/2018    HGB 9.6 (L) 09/06/2018   HCT 30.4 (L) 09/06/2018   MCV 69.1 Repeated and verified X2. (L) 09/06/2018   PLT 364.0 09/06/2018   Lab Results  Component Value Date   NA 137 09/06/2018  K 4.5 09/06/2018   CO2 25 09/06/2018   GLUCOSE 97 09/06/2018   BUN 16 09/06/2018   CREATININE 0.81 09/06/2018   BILITOT 0.4 09/06/2018   ALKPHOS 67 09/06/2018   AST 21 09/06/2018   ALT 13 09/06/2018   PROT 7.4 09/06/2018   ALBUMIN 4.4 09/06/2018   CALCIUM 9.7 09/06/2018   GFR 103.72 09/06/2018   Lab Results  Component Value Date   CHOL 295 (H) 09/06/2018   Lab Results  Component Value Date   HDL 55.70 09/06/2018   Lab Results  Component Value Date   LDLCALC 105 (H) 07/24/2017   Lab Results  Component Value Date   TRIG (H) 09/06/2018    581.0 Triglyceride is over 400; calculations on Lipids are invalid.   Lab Results  Component Value Date   CHOLHDL 5 09/06/2018   No results found for: HGBA1C     Assessment & Plan:   Problem List Items Addressed This Visit    Generalized anxiety disorder (Chronic)    Continues to do well on Sertraline. Refills given      Lipid disorder - Primary    Encouraged heart healthy diet, increase exercise, avoid trans fats, consider a krill oil cap daily. Tolerating Zetia, Atorvastatin      Relevant Orders   Lipid panel   Anemia    Increase leafy greens, consider increased lean red meat and using cast iron cookware. Continue to monitor, report any concerns check labs      Relevant Orders   Comprehensive metabolic panel   CBC   Ferritin      I am having Abdurrahman H. Angello maintain his triamcinolone cream, ezetimibe, Ferrous Fumarate-Folic Acid, rosuvastatin, and sertraline.  No orders of the defined types were placed in this encounter.   I discussed the assessment and treatment plan with the patient. The patient was provided an opportunity to ask questions and all were answered. The patient agreed with the plan and demonstrated an understanding of  the instructions.   The patient was advised to call back or seek an in-person evaluation if the symptoms worsen or if the condition fails to improve as anticipated.  I provided 25 minutes of non-face-to-face time during this encounter.   Penni Homans, MD

## 2019-03-13 NOTE — Assessment & Plan Note (Signed)
Increase leafy greens, consider increased lean red meat and using cast iron cookware. Continue to monitor, report any concerns check labs

## 2019-03-13 NOTE — Assessment & Plan Note (Signed)
Encouraged heart healthy diet, increase exercise, avoid trans fats, consider a krill oil cap daily. Tolerating Zetia, Atorvastatin

## 2019-03-15 ENCOUNTER — Other Ambulatory Visit: Payer: Self-pay | Admitting: Family Medicine

## 2019-03-15 ENCOUNTER — Other Ambulatory Visit: Payer: Self-pay

## 2019-03-15 ENCOUNTER — Other Ambulatory Visit (INDEPENDENT_AMBULATORY_CARE_PROVIDER_SITE_OTHER): Payer: No Typology Code available for payment source

## 2019-03-15 DIAGNOSIS — D649 Anemia, unspecified: Secondary | ICD-10-CM | POA: Diagnosis not present

## 2019-03-15 DIAGNOSIS — E789 Disorder of lipoprotein metabolism, unspecified: Secondary | ICD-10-CM | POA: Diagnosis not present

## 2019-03-15 LAB — LIPID PANEL
Cholesterol: 180 mg/dL (ref 0–200)
HDL: 67.2 mg/dL (ref >39.00)
LDL Cholesterol: 85 mg/dL (ref 0–99)
NonHDL: 113.28
Total CHOL/HDL Ratio: 3
Triglycerides: 140 mg/dL (ref 0.0–149.0)
VLDL: 28 mg/dL (ref 0.0–40.0)

## 2019-03-15 LAB — RETICULOCYTES
ABS Retic: 64680 cells/uL (ref 25000–9000)
Retic Ct Pct: 1.4 %

## 2019-03-15 LAB — COMPREHENSIVE METABOLIC PANEL
ALT: 25 U/L (ref 0–53)
AST: 32 U/L (ref 0–37)
Albumin: 4.5 g/dL (ref 3.5–5.2)
Alkaline Phosphatase: 66 U/L (ref 39–117)
BUN: 22 mg/dL (ref 6–23)
CO2: 25 mEq/L (ref 19–32)
Calcium: 9.2 mg/dL (ref 8.4–10.5)
Chloride: 104 mEq/L (ref 96–112)
Creatinine, Ser: 0.92 mg/dL (ref 0.40–1.50)
GFR: 84.1 mL/min (ref >60.00)
Glucose, Bld: 97 mg/dL (ref 70–99)
Potassium: 4.6 mEq/L (ref 3.5–5.1)
Sodium: 137 mEq/L (ref 135–145)
Total Bilirubin: 0.5 mg/dL (ref 0.2–1.2)
Total Protein: 7.5 g/dL (ref 6.0–8.3)

## 2019-03-15 LAB — CBC
HCT: 29.3 % — ABNORMAL LOW (ref 39.0–52.0)
Hemoglobin: 9.1 g/dL — ABNORMAL LOW (ref 13.0–17.0)
MCHC: 31.1 g/dL (ref 30.0–36.0)
MCV: 64.5 fl — ABNORMAL LOW (ref 78.0–100.0)
Platelets: 306 10*3/uL (ref 150.0–400.0)
RBC: 4.54 Mil/uL (ref 4.22–5.81)
RDW: 20.1 % — ABNORMAL HIGH (ref 11.5–15.5)
WBC: 3.6 10*3/uL — ABNORMAL LOW (ref 4.0–10.5)

## 2019-03-15 LAB — FERRITIN: Ferritin: 2.9 ng/mL — ABNORMAL LOW (ref 22.0–322.0)

## 2019-03-21 ENCOUNTER — Telehealth: Payer: Self-pay | Admitting: Family Medicine

## 2019-03-21 ENCOUNTER — Telehealth: Payer: Self-pay | Admitting: Family

## 2019-03-21 ENCOUNTER — Ambulatory Visit (INDEPENDENT_AMBULATORY_CARE_PROVIDER_SITE_OTHER): Payer: No Typology Code available for payment source | Admitting: Family Medicine

## 2019-03-21 ENCOUNTER — Other Ambulatory Visit: Payer: Self-pay

## 2019-03-21 DIAGNOSIS — D649 Anemia, unspecified: Secondary | ICD-10-CM

## 2019-03-21 DIAGNOSIS — E789 Disorder of lipoprotein metabolism, unspecified: Secondary | ICD-10-CM | POA: Diagnosis not present

## 2019-03-21 NOTE — Telephone Encounter (Signed)
Please advise 

## 2019-03-21 NOTE — Assessment & Plan Note (Signed)
Called patient and discussed his recent lab results. He has become essentially Vegan and has only started taking his chewable iron supplement since having his most recent labs run. He also notes he does occasionally still see a small amount of blood after a BM on the tissue since having his hemorrhoids banded. Roughly 5 episodes a month. Did have a colonoscopy in 2018 but if hemeoccults remain positive may need referral back to GI. He has already been referred to hematology for further consideration since he is not improving.

## 2019-03-21 NOTE — Assessment & Plan Note (Signed)
Greatly improved with current meds and dietary changes. No further changes at this time

## 2019-03-21 NOTE — Progress Notes (Signed)
Virtual Visit via Video Note  I connected with Timothy Hoffman on 03/21/19 at 12:00 PM EDT by a video enabled telemedicine application and verified that I am speaking with the correct person using two identifiers.  Location: Patient: home Provider: home   I discussed the limitations of evaluation and management by telemedicine and the availability of in person appointments. The patient expressed understanding and agreed to proceed.     Subjective:    Patient ID: Timothy Hoffman, male    DOB: August 22, 1961, 58 y.o.   MRN: [id number]  Chief Complaint  Patient presents with  . Review labs    HPI Patient is in today for follow up on recent lab work. He had a CBC which showed anemia is not improving so he was referred to hematology for further consideration but that happened faster than we were able to contact him about results. Over all he feels well. He acknowledges he is essentially vegan and has just started a chewable iron supplement this week. Still sees a scant amount of BRBPR when wiping after BM roughly 5 x a month. No pain or other change in BMs. He is acknowledging some increased fatigue and a sense of being winded when he exerts himself. Denies CP/palp/HA/congestion/fevers/GI or GU c/o. Taking meds as prescribed  Past Medical History:  Diagnosis Date  . Allergy    SEASONAL  . Anemia   . Anxiety   . Arrhythmia 1999  . Atopic dermatitis   . Hyperlipemia   . Internal hemorrhoid, bleeding 2013    Past Surgical History:  Procedure Laterality Date  . KNEE ARTHROSCOPY  2012  . Right shoulder scope      Family History  Problem Relation Age of Onset  . Lymphoma Father   . Cancer Father        nonHodgkin's lymphoma  . Hyperlipidemia Mother   . Hypertension Mother   . Hypertension Brother   . Hyperlipidemia Brother   . Mental illness Son        Anxiety  . Anxiety disorder Son   . Stroke Maternal Grandmother   . Colon cancer Neg Hx     Social History   Socioeconomic  History  . Marital status: Married    Spouse name: Erline Levine  . Number of children: 2  . Years of education: college  . Highest education level: Not on file  Occupational History  . Occupation: Automotive engineer: [employer]  Social Needs  . Financial resource strain: Not hard at all  . Food insecurity:    Worry: Never true    Inability: Never true  . Transportation needs:    Medical: No    Non-medical: No  Tobacco Use  . Smoking status: Never Smoker  . Smokeless tobacco: Never Used  . Tobacco comment: 20 yrs ago   Substance and Sexual Activity  . Alcohol use: Yes    Alcohol/week: 2.0 standard drinks    Types: 2 Cans of beer per week  . Drug use: No  . Sexual activity: Yes    Partners: Female  Lifestyle  . Physical activity:    Days per week: 6 days    Minutes per session: 50 min  . Stress: Not on file  Relationships  . Social connections:    Talks on phone: More than three times a week    Gets together: Three times a week    Attends religious service: Never    Active member of club or organization:  No    Attends meetings of clubs or organizations: Never    Relationship status: Married  . Intimate partner violence:    Fear of current or ex partner: No    Emotionally abused: No    Physically abused: No    Forced sexual activity: No  Other Topics Concern  . Not on file  Social History Narrative   Lives with his wife and their 2 children.     His wife is an Therapist, sports.     Their son has significant anxiety.   He exercises by running 3-5 miles 4x/week.      Vegan    Outpatient Medications Prior to Visit  Medication Sig Dispense Refill  . ezetimibe (ZETIA) 10 MG tablet Take 1 tablet (10 mg total) by mouth daily. 90 tablet 3  . rosuvastatin (CRESTOR) 40 MG tablet Take 1 tablet (40 mg total) by mouth daily. 90 tablet 3  . sertraline (ZOLOFT) 25 MG tablet TAKE 1 TABLET BY MOUTH DAILY. 90 tablet 1  . triamcinolone cream (KENALOG) 0.1 % Apply topically daily. 80 g  1  . Ferrous Fumarate-Folic Acid (HEMOCYTE-F) 324-1 MG TABS Take 1 tablet by mouth daily. 30 each 3   No facility-administered medications prior to visit.     Allergies  Allergen Reactions  . Rose Hips [Ascorbate]   . Tequin Rash    Review of Systems  Constitutional: Negative for fever and malaise/fatigue.  HENT: Negative for congestion.   Eyes: Negative for blurred vision.  Respiratory: Negative for shortness of breath.   Cardiovascular: Negative for chest pain, palpitations and leg swelling.  Gastrointestinal: Negative for abdominal pain, blood in stool and nausea.  Genitourinary: Negative for dysuria and frequency.  Musculoskeletal: Negative for falls.  Skin: Negative for rash.  Neurological: Negative for dizziness, loss of consciousness and headaches.  Endo/Heme/Allergies: Negative for environmental allergies.  Psychiatric/Behavioral: Negative for depression. The patient is not nervous/anxious.        Objective:    Physical Exam Constitutional:      Appearance: Normal appearance. He is not ill-appearing.  HENT:     Head: Normocephalic and atraumatic.  Eyes:     General:        Right eye: No discharge.        Left eye: No discharge.  Pulmonary:     Effort: Pulmonary effort is normal.  Neurological:     Mental Status: He is alert and oriented to person, place, and time.  Psychiatric:        Mood and Affect: Mood normal.        Behavior: Behavior normal.     There were no vitals taken for this visit. Wt Readings from Last 3 Encounters:  09/06/18 181 lb (82.1 kg)  07/24/17 186 lb (84.4 kg)  12/22/16 186 lb (84.4 kg)    Diabetic Foot Exam - Simple   No data filed     Lab Results  Component Value Date   WBC 3.6 (L) 03/15/2019   HGB 9.1 (L) 03/15/2019   HCT 29.3 (L) 03/15/2019   PLT 306.0 03/15/2019   GLUCOSE 97 03/15/2019   CHOL 180 03/15/2019   TRIG 140.0 03/15/2019   HDL 67.20 03/15/2019   LDLDIRECT 113.0 09/06/2018   LDLCALC 85 03/15/2019   ALT  25 03/15/2019   AST 32 03/15/2019   NA 137 03/15/2019   K 4.6 03/15/2019   CL 104 03/15/2019   CREATININE 0.92 03/15/2019   BUN 22 03/15/2019   CO2 25 03/15/2019  TSH 2.63 09/06/2018   PSA 2.24 09/06/2018    Lab Results  Component Value Date   TSH 2.63 09/06/2018   Lab Results  Component Value Date   WBC 3.6 (L) 03/15/2019   HGB 9.1 (L) 03/15/2019   HCT 29.3 (L) 03/15/2019   MCV 64.5 Repeated and verified X2. (L) 03/15/2019   PLT 306.0 03/15/2019   Lab Results  Component Value Date   NA 137 03/15/2019   K 4.6 03/15/2019   CO2 25 03/15/2019   GLUCOSE 97 03/15/2019   BUN 22 03/15/2019   CREATININE 0.92 03/15/2019   BILITOT 0.5 03/15/2019   ALKPHOS 66 03/15/2019   AST 32 03/15/2019   ALT 25 03/15/2019   PROT 7.5 03/15/2019   ALBUMIN 4.5 03/15/2019   CALCIUM 9.2 03/15/2019   GFR 84.10 03/15/2019   Lab Results  Component Value Date   CHOL 180 03/15/2019   Lab Results  Component Value Date   HDL 67.20 03/15/2019   Lab Results  Component Value Date   LDLCALC 85 03/15/2019   Lab Results  Component Value Date   TRIG 140.0 03/15/2019   Lab Results  Component Value Date   CHOLHDL 3 03/15/2019   No results found for: HGBA1C     Assessment & Plan:   Problem List Items Addressed This Visit    Lipid disorder    Greatly improved with current meds and dietary changes. No further changes at this time      Anemia    Called patient and discussed his recent lab results. He has become essentially Vegan and has only started taking his chewable iron supplement since having his most recent labs run. He also notes he does occasionally still see a small amount of blood after a BM on the tissue since having his hemorrhoids banded. Roughly 5 episodes a month. Did have a colonoscopy in 2018 but if hemeoccults remain positive may need referral back to GI. He has already been referred to hematology for further consideration since he is not improving.         I have  discontinued Akshath H. Mahrt's Ferrous Fumarate-Folic Acid. I am also having him maintain his triamcinolone cream, ezetimibe, rosuvastatin, and sertraline.  No orders of the defined types were placed in this encounter.  I discussed the assessment and treatment plan with the patient. The patient was provided an opportunity to ask questions and all were answered. The patient agreed with the plan and demonstrated an understanding of the instructions.   The patient was advised to call back or seek an in-person evaluation if the symptoms worsen or if the condition fails to improve as anticipated.  I provided 15 minutes of non-face-to-face time during this encounter.   Penni Homans, MD

## 2019-03-21 NOTE — Telephone Encounter (Signed)
Copied from [place] [id number]. Topic: Quick Communication - See Telephone Encounter >> Mar 21, 2019 10:20 AM Loma [place] wrote: CRM for notification. See Telephone encounter for: 03/21/19. Pt is very upset has received a notification of referral to oncology and does not know why?  Please FU at [phone]

## 2019-03-21 NOTE — Telephone Encounter (Signed)
Spoke with patient, thanks.

## 2019-03-21 NOTE — Telephone Encounter (Signed)
lvm to inform pt of new patient appt 6/1 at 115 pm

## 2019-03-31 ENCOUNTER — Other Ambulatory Visit (INDEPENDENT_AMBULATORY_CARE_PROVIDER_SITE_OTHER): Payer: No Typology Code available for payment source

## 2019-03-31 DIAGNOSIS — D649 Anemia, unspecified: Secondary | ICD-10-CM | POA: Diagnosis not present

## 2019-03-31 LAB — FECAL OCCULT BLOOD, IMMUNOCHEMICAL: Fecal Occult Bld: NEGATIVE

## 2019-04-01 ENCOUNTER — Other Ambulatory Visit: Payer: Self-pay | Admitting: Family

## 2019-04-01 DIAGNOSIS — D649 Anemia, unspecified: Secondary | ICD-10-CM

## 2019-04-04 ENCOUNTER — Inpatient Hospital Stay: Payer: No Typology Code available for payment source | Attending: Family | Admitting: Family

## 2019-04-04 ENCOUNTER — Inpatient Hospital Stay: Payer: No Typology Code available for payment source

## 2019-04-04 ENCOUNTER — Encounter: Payer: Self-pay | Admitting: Family

## 2019-04-04 ENCOUNTER — Other Ambulatory Visit: Payer: Self-pay

## 2019-04-04 VITALS — BP 149/88 | HR 90 | Temp 98.6°F | Resp 18 | Ht 70.0 in | Wt 188.8 lb

## 2019-04-04 DIAGNOSIS — Z807 Family history of other malignant neoplasms of lymphoid, hematopoietic and related tissues: Secondary | ICD-10-CM | POA: Insufficient documentation

## 2019-04-04 DIAGNOSIS — L209 Atopic dermatitis, unspecified: Secondary | ICD-10-CM | POA: Insufficient documentation

## 2019-04-04 DIAGNOSIS — K649 Unspecified hemorrhoids: Secondary | ICD-10-CM | POA: Insufficient documentation

## 2019-04-04 DIAGNOSIS — R5383 Other fatigue: Secondary | ICD-10-CM

## 2019-04-04 DIAGNOSIS — Z86010 Personal history of colonic polyps: Secondary | ICD-10-CM | POA: Diagnosis not present

## 2019-04-04 DIAGNOSIS — R58 Hemorrhage, not elsewhere classified: Secondary | ICD-10-CM | POA: Insufficient documentation

## 2019-04-04 DIAGNOSIS — D509 Iron deficiency anemia, unspecified: Secondary | ICD-10-CM | POA: Diagnosis not present

## 2019-04-04 DIAGNOSIS — D508 Other iron deficiency anemias: Secondary | ICD-10-CM

## 2019-04-04 DIAGNOSIS — D649 Anemia, unspecified: Secondary | ICD-10-CM

## 2019-04-04 DIAGNOSIS — R0609 Other forms of dyspnea: Secondary | ICD-10-CM

## 2019-04-04 LAB — RETICULOCYTES
Immature Retic Fract: 31.6 % — ABNORMAL HIGH (ref 2.3–15.9)
RBC.: 4.29 MIL/uL (ref 4.22–5.81)
Retic Count, Absolute: 81.5 10*3/uL (ref 19.0–186.0)
Retic Ct Pct: 1.9 % (ref 0.4–3.1)

## 2019-04-04 LAB — CMP (CANCER CENTER ONLY)
ALT: 22 U/L (ref 0–44)
AST: 27 U/L (ref 15–41)
Albumin: 4.6 g/dL (ref 3.5–5.0)
Alkaline Phosphatase: 57 U/L (ref 38–126)
Anion gap: 9 (ref 5–15)
BUN: 20 mg/dL (ref 6–20)
CO2: 27 mmol/L (ref 22–32)
Calcium: 9 mg/dL (ref 8.9–10.3)
Chloride: 105 mmol/L (ref 98–111)
Creatinine: 0.86 mg/dL (ref 0.61–1.24)
GFR, Est AFR Am: >60 mL/min (ref >60)
GFR, Estimated: >60 mL/min (ref >60)
Glucose, Bld: 105 mg/dL — ABNORMAL HIGH (ref 70–99)
Potassium: 4.6 mmol/L (ref 3.5–5.1)
Sodium: 141 mmol/L (ref 135–145)
Total Bilirubin: 0.4 mg/dL (ref 0.3–1.2)
Total Protein: 7.6 g/dL (ref 6.5–8.1)

## 2019-04-04 LAB — CBC WITH DIFFERENTIAL (CANCER CENTER ONLY)
Abs Immature Granulocytes: 0.01 10*3/uL (ref 0.00–0.07)
Basophils Absolute: 0.1 10*3/uL (ref 0.0–0.1)
Basophils Relative: 1 %
Eosinophils Absolute: 0.1 10*3/uL (ref 0.0–0.5)
Eosinophils Relative: 1 %
HCT: 30.6 % — ABNORMAL LOW (ref 39.0–52.0)
Hemoglobin: 8.7 g/dL — ABNORMAL LOW (ref 13.0–17.0)
Immature Granulocytes: 0 %
Lymphocytes Relative: 23 %
Lymphs Abs: 1 10*3/uL (ref 0.7–4.0)
MCH: 20.1 pg — ABNORMAL LOW (ref 26.0–34.0)
MCHC: 28.4 g/dL — ABNORMAL LOW (ref 30.0–36.0)
MCV: 70.7 fL — ABNORMAL LOW (ref 80.0–100.0)
Monocytes Absolute: 0.6 10*3/uL (ref 0.1–1.0)
Monocytes Relative: 14 %
Neutro Abs: 2.7 10*3/uL (ref 1.7–7.7)
Neutrophils Relative %: 61 %
Platelet Count: 334 10*3/uL (ref 150–400)
RBC: 4.33 MIL/uL (ref 4.22–5.81)
RDW: 22.3 % — ABNORMAL HIGH (ref 11.5–15.5)
WBC Count: 4.4 10*3/uL (ref 4.0–10.5)
nRBC: 0 % (ref 0.0–0.2)

## 2019-04-04 LAB — VITAMIN B12: Vitamin B-12: 385 pg/mL (ref 180–914)

## 2019-04-04 LAB — SAVE SMEAR(SSMR), FOR PROVIDER SLIDE REVIEW

## 2019-04-04 LAB — LACTATE DEHYDROGENASE: LDH: 233 U/L — ABNORMAL HIGH (ref 98–192)

## 2019-04-04 MED ORDER — FOLIC ACID 1 MG PO TABS: 1.0000 mg | ORAL_TABLET | Freq: Every day | ORAL | 6 refills | Status: DC

## 2019-04-04 MED FILL — FOLIC ACID 1 MG TABS: 1 | 30 days supply | Qty: 30 | Fill #0

## 2019-04-04 NOTE — Progress Notes (Signed)
Hematology/Oncology Consultation   Name: Timothy Hoffman      MRN: [id number]    Location: Room/bed info not found  Date: 04/04/2019 Time:1:50 PM   REFERRING PHYSICIAN: Penni Homans, MD  REASON FOR CONSULT: Anemia unspecified   DIAGNOSIS: Iron deficiency anemia   HISTORY OF PRESENT ILLNESS: Timothy Hoffman is a very pleasant 58 yo caucasian gentleman with iron deficiency anemia.  Hgb is 8.7 and MCV 70.  He has had fatigue and SOB with exertion.   He has history of internal hemorrhoids and had 3 polyps removed during colonoscopy in 2018 by Dr. Henrene Pastor.  Stool sample last week was negative for blood.  He has not noted any obvious bleeding this week. He had been noticing some bright red blood with his stool a week or two ago. No bruising or petechiae.  He has had surgery on both the right shoulder and knee as well as having his wisdom teeth removed without any complications.  He enjoys walking/running for exercise. This has been difficult lately due to the SOB/fatigue.  He states that he is not diabetic and has no issues with his thyroid.  No family history of anemia.  His father had non-Hodgkin's lymphoma.  He has atopic dermatitis and uses Kenalog cream as needed.  No fever, chills, n/v, cough, dizziness, chest pain, palpitations, abdominal pain or changes in bowel or bladder habits.  No swelling, tenderness, numbness or tingling in her extremities.  No lymphadenopathy noted on exam.  He does not smoke.  He states that lately he has been drinking 3-4 beers in the evenings. There is some stress at home.  He has maintained a good appetite and states that he is mostly vegan. He will occasionally have fish or chicken. He is staying well hydrated and his weight has remained stable.  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   ROS: All other 10 point review of systems is negative.   PAST MEDICAL HISTORY:   Past Medical History:  Diagnosis Date  . Allergy    SEASONAL  . Anemia    . Anxiety   . Arrhythmia 1999  . Atopic dermatitis   . Hyperlipemia   . Internal hemorrhoid, bleeding 2013    ALLERGIES: Allergies  Allergen Reactions  . Rose Hips [Ascorbate] Itching and Rash  . Tequin Rash      MEDICATIONS:  Current Outpatient Medications on File Prior to Visit  Medication Sig Dispense Refill  . ezetimibe (ZETIA) 10 MG tablet Take 1 tablet (10 mg total) by mouth daily. 90 tablet 3  . rosuvastatin (CRESTOR) 40 MG tablet Take 1 tablet (40 mg total) by mouth daily. 90 tablet 3  . sertraline (ZOLOFT) 25 MG tablet TAKE 1 TABLET BY MOUTH DAILY. 90 tablet 1  . triamcinolone cream (KENALOG) 0.1 % Apply topically daily. 80 g 1   No current facility-administered medications on file prior to visit.      PAST SURGICAL HISTORY Past Surgical History:  Procedure Laterality Date  . KNEE ARTHROSCOPY  2012  . Right shoulder scope      FAMILY HISTORY: Family History  Problem Relation Age of Onset  . Lymphoma Father   . Cancer Father        nonHodgkin's lymphoma  . Hyperlipidemia Mother   . Hypertension Mother   . Hypertension Brother   . Hyperlipidemia Brother   . Mental illness Son        Anxiety  . Anxiety disorder Son   .  Stroke Maternal Grandmother   . Colon cancer Neg Hx     SOCIAL HISTORY:  reports that he has never smoked. He has never used smokeless tobacco. He reports current alcohol use of about 2.0 standard drinks of alcohol per week. He reports that he does not use drugs.  PERFORMANCE STATUS: The patient's performance status is 1 - Symptomatic but completely ambulatory  PHYSICAL EXAM: Most Recent Vital Signs: There were no vitals taken for this visit. BP (!) 149/88 (BP Location: Right Arm, Patient Position: Sitting)   Pulse 90   Temp 98.6 F (37 C) (Oral)   Resp 18   Ht 5\' 10"  (1.778 m)   Wt 188 lb 12.8 oz (85.6 kg)   SpO2 100%   BMI 27.09 kg/m   General Appearance:    Alert, cooperative, no distress, appears stated age  Head:     Normocephalic, without obvious abnormality, atraumatic  Eyes:    PERRL, conjunctiva/corneas clear, EOM's intact, fundi    benign, both eyes             Throat:   Lips, mucosa, and tongue normal; teeth and gums normal  Neck:   Supple, symmetrical, trachea midline, no adenopathy;       thyroid:  No enlargement/tenderness/nodules; no carotid   bruit or JVD  Back:     Symmetric, no curvature, ROM normal, no CVA tenderness  Lungs:     Clear to auscultation bilaterally, respirations unlabored  Chest wall:    No tenderness or deformity  Heart:    Regular rate and rhythm, S1 and S2 normal, no murmur, rub   or gallop  Abdomen:     Soft, non-tender, bowel sounds active all four quadrants,    no masses, no organomegaly        Extremities:   Extremities normal, atraumatic, no cyanosis or edema  Pulses:   2+ and symmetric all extremities  Skin:   Skin color, texture, turgor normal, no rashes or lesions  Lymph nodes:   Cervical, supraclavicular, and axillary nodes normal  Neurologic:   CNII-XII intact. Normal strength, sensation and reflexes      throughout    LABORATORY DATA:  Results for orders placed or performed in visit on 04/04/19 (from the past 48 hour(s))  CBC with Differential ([place] Only)     Status: Abnormal   Collection Time: 04/04/19  1:27 PM  Result Value Ref Range   WBC Count 4.4 4.0 - 10.5 K/uL   RBC 4.33 4.22 - 5.81 MIL/uL   Hemoglobin 8.7 (L) 13.0 - 17.0 g/dL    Comment: Reticulocyte Hemoglobin testing may be clinically indicated, consider ordering this additional test [id number]    HCT 30.6 (L) 39.0 - 52.0 %   MCV 70.7 (L) 80.0 - 100.0 fL   MCH 20.1 (L) 26.0 - 34.0 pg   MCHC 28.4 (L) 30.0 - 36.0 g/dL   RDW 22.3 (H) 11.5 - 15.5 %   Platelet Count 334 150 - 400 K/uL   nRBC 0.0 0.0 - 0.2 %   Neutrophils Relative % 61 %   Neutro Abs 2.7 1.7 - 7.7 K/uL   Lymphocytes Relative 23 %   Lymphs Abs 1.0 0.7 - 4.0 K/uL   Monocytes Relative 14 %   Monocytes Absolute 0.6  0.1 - 1.0 K/uL   Eosinophils Relative 1 %   Eosinophils Absolute 0.1 0.0 - 0.5 K/uL   Basophils Relative 1 %   Basophils Absolute 0.1 0.0 - 0.1 K/uL  Immature Granulocytes 0 %   Abs Immature Granulocytes 0.01 0.00 - 0.07 K/uL    Comment: Performed at [facility] Lab at [facility], [address] [place], [place] [address]  Save [facility])     Status: None   Collection Time: 04/04/19  1:27 PM  Result Value Ref Range   Smear Review SMEAR STAINED AND AVAILABLE FOR REVIEW     Comment: Performed at [facility] Lab at [facility], [address] [place], [place] [address]  Reticulocytes     Status: Abnormal   Collection Time: 04/04/19  1:28 PM  Result Value Ref Range   Retic Ct Pct 1.9 0.4 - 3.1 %   RBC. 4.29 4.22 - 5.81 MIL/uL   Retic Count, Absolute 81.5 19.0 - 186.0 K/uL   Immature Retic Fract 31.6 (H) 2.3 - 15.9 %    Comment: Performed at [facility] Lab at [facility], [address] [place], [place] [address]      RADIOGRAPHY: No results found.     PATHOLOGY: None  ASSESSMENT/PLAN: Timothy Hoffman is a very pleasant 58 yo caucasian gentleman with iron deficiency anemia.  He is symptomatic with fatigue and SOB on exertion. He is not in any visible distress at this time.  We will see what his iron studies and hemoglobinopathy eval show and bring him back in for infusion if needed.  We will have him start taking folic acid 1 mg PO daily.   All questions were answered and he is in agreement with the plan. He will contact our office with any questions or concerns. We can certainly see him sooner if need be.   He was discussed with and also seen by Dr. Marin Olp and he is in agreement with the aforementioned.   Laverna Peace, NP    Addendum: I saw and examined Timothy Hoffman with Judson Roch.  I agree with her above assessment.  I am sure that he is losing on we have the blood loss secondary to his hemorrhoids.  It  sounds like he is going need another procedure for his hemorrhoids.  I looked at his blood smear under the microscope.  He had microcytic and hypochromic red blood cells.  There is a mild anisocytosis and poikilocytosis.  I saw no nucleated red blood cells.  He had no teardrop cells.  There were no schistocytes.  White blood cells appear normal in morphology maturation.  There were no hyper segmented polys.  He had no immature myeloid or lymphoid cells.  Platelets were adequate in number and size.  We will see what his iron studies are.  However, his past iron studies are quite low.  His blood smear is highly consistent with iron deficiency.  He has had a thorough work-up.  His stool has been Hemoccult negative.  He has seen gastroenterology.  We will start him on IV iron.  I know this will help.  We will give him 2 doses given his low iron levels.  I think 2 doses would be appropriate for him.  We spent about 45 minutes with Timothy Hoffman  All time spent counseling him.  He had arranged for his IV iron.  We answered all of his questions.  I will see him back in a month or so.  By then, his MCV should be coming up quite nicely.  Of note, he has a very low reticulocyte count which goes along with the  iron deficiency.  Lattie Haw, MD

## 2019-04-05 ENCOUNTER — Telehealth: Payer: Self-pay | Admitting: Family

## 2019-04-05 LAB — IRON AND TIBC
Iron: 63 ug/dL (ref 42–163)
Saturation Ratios: 12 % — ABNORMAL LOW (ref 20–55)
TIBC: 515 ug/dL — ABNORMAL HIGH (ref 202–409)
UIBC: 453 ug/dL — ABNORMAL HIGH (ref 117–376)

## 2019-04-05 LAB — ERYTHROPOIETIN: Erythropoietin: 82.9 m[IU]/mL — ABNORMAL HIGH (ref 2.6–18.5)

## 2019-04-05 LAB — FERRITIN: Ferritin: <4 ng/mL — ABNORMAL LOW (ref 24–336)

## 2019-04-05 NOTE — Telephone Encounter (Signed)
Called and LMVM for patient with date/time of appointment.  I asked him to call back to confirm/ per 6/2 sch msg

## 2019-04-06 ENCOUNTER — Inpatient Hospital Stay: Payer: No Typology Code available for payment source

## 2019-04-06 ENCOUNTER — Other Ambulatory Visit: Payer: Self-pay

## 2019-04-06 VITALS — BP 131/87 | HR 74 | Temp 98.7°F | Resp 18

## 2019-04-06 DIAGNOSIS — D509 Iron deficiency anemia, unspecified: Secondary | ICD-10-CM | POA: Diagnosis not present

## 2019-04-06 DIAGNOSIS — D508 Other iron deficiency anemias: Secondary | ICD-10-CM

## 2019-04-06 LAB — HEMOGLOBINOPATHY EVALUATION
Hgb A2 Quant: 1.4 % — ABNORMAL LOW (ref 1.8–3.2)
Hgb A: 98.6 % (ref 96.4–98.8)
Hgb C: 0 %
Hgb F Quant: 0 % (ref 0.0–2.0)
Hgb S Quant: 0 %
Hgb Variant: 0 %

## 2019-04-06 MED ORDER — SODIUM CHLORIDE 0.9 % IV SOLN
Freq: Once | INTRAVENOUS | Status: AC
  Administered 2019-04-06: 11:00:00 via INTRAVENOUS
  Filled 2019-04-06: qty 250

## 2019-04-06 MED ORDER — SODIUM CHLORIDE 0.9 % IV SOLN
510.0000 mg | Freq: Once | INTRAVENOUS | Status: AC
  Administered 2019-04-06: 510 mg via INTRAVENOUS
  Filled 2019-04-06: qty 17

## 2019-04-06 NOTE — Patient Instructions (Signed)

## 2019-04-13 ENCOUNTER — Other Ambulatory Visit: Payer: Self-pay

## 2019-04-13 ENCOUNTER — Inpatient Hospital Stay: Payer: No Typology Code available for payment source

## 2019-04-13 VITALS — BP 133/90 | HR 97 | Temp 98.8°F | Resp 18

## 2019-04-13 DIAGNOSIS — D509 Iron deficiency anemia, unspecified: Secondary | ICD-10-CM | POA: Diagnosis not present

## 2019-04-13 DIAGNOSIS — D508 Other iron deficiency anemias: Secondary | ICD-10-CM

## 2019-04-13 MED ORDER — SODIUM CHLORIDE 0.9 % IV SOLN
510.0000 mg | Freq: Once | INTRAVENOUS | Status: AC
  Administered 2019-04-13: 510 mg via INTRAVENOUS
  Filled 2019-04-13: qty 17

## 2019-04-13 MED ORDER — SODIUM CHLORIDE 0.9 % IV SOLN
Freq: Once | INTRAVENOUS | Status: AC
  Administered 2019-04-13: 11:00:00 via INTRAVENOUS
  Filled 2019-04-13: qty 250

## 2019-04-13 NOTE — Patient Instructions (Signed)

## 2019-04-27 ENCOUNTER — Ambulatory Visit: Payer: Self-pay | Admitting: Surgery

## 2019-04-27 NOTE — H&P (Signed)
Chinedum Vanhouten Bassford Documented: 04/27/2019 8:46 AM Location: [facility] Patient #: [id number] DOB: January 08, 1961 Married / Language: English / Race: White Male  History of Present Illness Timothy Hector MD; 04/27/2019 5:39 PM) The patient is a 58 year old male who presents with a complaint of anal problems. Note for "Anal problems": ` ` ` Patient sent for surgical consultation at the request of Dr Grandville Silos  Chief Complaint: Persistent hemorrhoid problems. Consideration of surgery. ` ` The patient is a pleasant gentleman with history of colon polyps. Last colonoscopy by Dr. Scarlette Shorts with  gastroenterlogy 2018 with acute or adenoma. His had some iron deficiency anemia and intermittent bleeding. Found to have symptomatically hemorrhoid that was banded. Has recurrent symptoms. Wished to consider surgery. Referred to myself to consider hemorrhoidectomy. Patient's had some persistent anemia iron deficiency. He is recently got some iron infusions from Dr. Romeo Apple on an oral iron supplement. He notes blood when he wipes and on paper. He usually moves his bowels about twice a day without a Biber fiber supplement. No cardiac nor pulmonary issues. No smoking tobacco. He usually can walk several miles without difficulty. She gets a little tired after jogging, which makes him wonder if he has symptomatically anemia  No personal nor family history of GI/colon cancer, inflammatory bowel disease, irritable bowel syndrome, allergy such as Celiac Sprue, dietary/dairy problems, colitis, ulcers nor gastritis. No recent sick contacts/gastroenteritis. No travel outside the country. No changes in diet. No dysphagia to solids or liquids. No significant heartburn or reflux. No hematemesis, coffee ground emesis. No evidence of prior gastric/peptic ulceration.  (Review of systems as stated in this history (HPI) or in the review of systems. Otherwise all other 12 point ROS are  negative) ` ` `   Allergies (Sabrina Canty, CMA; 04/27/2019 8:46 AM) Tequin *FLUOROQUINOLONES* Allergies Reconciled  Medication History (Sabrina Canty, CMA; 04/27/2019 8:46 AM) Rosuvastatin Calcium (40MG  Tablet, Oral) Active. Sertraline HCl (25MG  Tablet, Oral) Active. Zetia (10MG  Tablet, Oral) Active. Folic Acid (1MG  Tablet, Oral) Active. Medications Reconciled    Vitals (Sabrina Canty CMA; 04/27/2019 8:47 AM) 04/27/2019 8:46 AM Weight: 187.38 lb Height: 70in Body Surface Area: 2.03 m Body Mass Index: 26.89 kg/m  Temp.: 99.22F(Oral)  Pulse: 97 (Regular)  BP: 172/90 (Sitting, Left Arm, Standard)        Physical Exam Timothy Hector MD; 04/27/2019 5:40 PM)  General Mental Status-Alert. General Appearance-Not in acute distress, Not Sickly. Orientation-Oriented X3. Hydration-Well hydrated. Voice-Normal.  Integumentary Global Assessment Upon inspection and palpation of skin surfaces of the - Axillae: non-tender, no inflammation or ulceration, no drainage. and Distribution of scalp and body hair is normal. General Characteristics Temperature - normal warmth is noted.  Head and Neck Head-normocephalic, atraumatic with no lesions or palpable masses. Face Global Assessment - atraumatic, no absence of expression. Neck Global Assessment - no abnormal movements, no bruit auscultated on the right, no bruit auscultated on the left, no decreased range of motion, non-tender. Trachea-midline. Thyroid Gland Characteristics - non-tender.  Eye Eyeball - Left-Extraocular movements intact, No Nystagmus - Left. Eyeball - Right-Extraocular movements intact, No Nystagmus - Right. Cornea - Left-No Hazy - Left. Cornea - Right-No Hazy - Right. Sclera/Conjunctiva - Left-No scleral icterus, No Discharge - Left. Sclera/Conjunctiva - Right-No scleral icterus, No Discharge - Right. Pupil - Left-Direct reaction to light normal. Pupil -  Right-Direct reaction to light normal. Note: Wears glasses. Vision corrected  ENMT Ears Pinna - Left - no drainage observed, no generalized tenderness observed. Pinna -  Right - no drainage observed, no generalized tenderness observed. Nose and Sinuses External Inspection of the Nose - no destructive lesion observed. Inspection of the nares - Left - quiet respiration. Inspection of the nares - Right - quiet respiration. Mouth and Throat Lips - Upper Lip - no fissures observed, no pallor noted. Lower Lip - no fissures observed, no pallor noted. Nasopharynx - no discharge present. Oral Cavity/Oropharynx - Tongue - no dryness observed. Oral Mucosa - no cyanosis observed. Hypopharynx - no evidence of airway distress observed.  Chest and Lung Exam Inspection Movements - Normal and Symmetrical. Accessory muscles - No use of accessory muscles in breathing. Palpation Palpation of the chest reveals - Non-tender. Auscultation Breath sounds - Normal and Clear.  Cardiovascular Auscultation Rhythm - Regular. Murmurs & Other Heart Sounds - Auscultation of the heart reveals - No Murmurs and No Systolic Clicks.  Abdomen Inspection Inspection of the abdomen reveals - No Visible peristalsis and No Abnormal pulsations. Umbilicus - No Bleeding, No Urine drainage. Palpation/Percussion Palpation and Percussion of the abdomen reveal - Soft, Non Tender, No Rebound tenderness, No Rigidity (guarding) and No Cutaneous hyperesthesia. Note: Abdomen flat and soft. Nontender. Not distended. Mild suprapubic umbilical diastases recti.No umbilical or incisional hernias. No guarding.  Male Genitourinary Sexual Maturity Tanner 5 - Adult hair pattern and Adult penile size and shape. Note: No inguinal hernias. Normal external genitalia. Epididymi, testes, and spermatic cords normal without any masses.  Rectal Note: Please refer to the anoscopy.  Right posterior grade 3 enlarged friable hemorrhoid easily  prolapses out. Easily bleeds. Right anterior and left lateral at least grade 2 and somewhat inflamed.  Mild perirectal pleuritis with some external hemorrhoidal tags. No fissure. No fistula. No abscess. Prostate smooth without enlargement or nodularity. No rectal masses felt to 6 cm. No stool in vault.  Peripheral Vascular Upper Extremity Inspection - Left - No Cyanotic nailbeds - Left, Not Ischemic. Inspection - Right - No Cyanotic nailbeds - Right, Not Ischemic.  Neurologic Neurologic evaluation reveals -normal attention span and ability to concentrate, able to name objects and repeat phrases. Appropriate fund of knowledge , normal sensation and normal coordination. Mental Status Affect - not angry, not paranoid. Cranial Nerves-Normal Bilaterally. Gait-Normal.  Neuropsychiatric Mental status exam performed with findings of-able to articulate well with normal speech/language, rate, volume and coherence, thought content normal with ability to perform basic computations and apply abstract reasoning and no evidence of hallucinations, delusions, obsessions or homicidal/suicidal ideation.  Musculoskeletal Global Assessment Spine, Ribs and Pelvis - no instability, subluxation or laxity. Right Upper Extremity - no instability, subluxation or laxity.  Lymphatic Head & Neck  General Head & Neck Lymphatics: Bilateral - Description - No Localized lymphadenopathy. Axillary  General Axillary Region: Bilateral - Description - No Localized lymphadenopathy. Femoral & Inguinal  Generalized Femoral & Inguinal Lymphatics: Left - Description - No Localized lymphadenopathy. Right - Description - No Localized lymphadenopathy.   Results Timothy Hector MD; 04/27/2019 5:41 PM) Procedures  Name Value Date Hemorrhoids Procedure Anal exam: External Hemorrhoid Internal exam: Internal Hemorroids ( non-bleeding) Internal Hemorrhoids (bleeding) prolapse Other: Right  posterior grade 3 enlarged friable hemorrhoid easily prolapses out. Easily bleeds. Right anterior and left lateral at least grade 2 and somewhat inflamed. ......  ...... Mild perirectal pleuritis with some external hemorrhoidal tags. No fissure. No fistula. No abscess. Prostate smooth without enlargement or nodularity. No rectal masses felt to 6 cm. No stool in vault.  Performed: 04/27/2019 9:26 AM    Assessment &  Plan Timothy Hector MD; 04/27/2019 5:40 PM)  PROLAPSED INTERNAL HEMORRHOIDS, GRADE 3 (K64.2) Impression: Persistent bleeding hemorrhoids. At least one pial partially prolapsing. Friable and irritated. I think he would fit from hemorrhoidal ligations less pexy. Probable hemorrhoidectomy of the obvious inflamed bleeding hemorrhoid. Outpatient surgery. He is interested in proceeding.  Should he have persistent anemia iron deficiency after hemorrhoid surgery > 3 months out, consider reevaluation by gastroenterology to rule out other etiologies.  Current Plans ANOSCOPY, DIAGNOSTIC (53664) You are being scheduled for surgery- Our schedulers will call you.  You should hear from our office's scheduling department within 5 working days about the location, date, and time of surgery. We try to make accommodations for patient's preferences in scheduling surgery, but sometimes the OR schedule or the surgeon's schedule prevents [place] from making those accommodations.  If you have not heard from our office [phone]) in 5 working days, call the office and ask for your surgeon's nurse.  If you have other questions about your diagnosis, plan, or surgery, call the office and ask for your surgeon's nurse.  Pt Education - Pamphlet Given - The Hemorrhoid Book: discussed with patient and provided information. The anatomy & physiology of the anorectal region was discussed. The pathophysiology of hemorrhoids and differential diagnosis was discussed. Natural history risks without surgery was  discussed. I stressed the importance of a bowel regimen to have daily soft bowel movements to minimize progression of disease. Interventions such as sclerotherapy & banding were discussed.  The patient's symptoms are not adequately controlled by medicines and other non-operative treatments. I feel the risks & problems of no surgery outweigh the operative risks; therefore, I recommended surgery to treat the hemorrhoids by ligation, pexy, and possible resection.  Risks such as bleeding, infection, urinary difficulties, need for further treatment, heart attack, death, and other risks were discussed. I noted a good likelihood this will help address the problem. Goals of post-operative recovery were discussed as well. Possibility that this will not correct all symptoms was explained. Post-operative pain, bleeding, constipation, and other problems after surgery were discussed. We will work to minimize complications. Educational handouts further explaining the pathology, treatment options, and bowel regimen were given as well. Questions were answered. The patient expresses understanding & wishes to proceed with surgery.   PROLAPSED INTERNAL HEMORRHOIDS, GRADE 2 (K64.1)  Current Plans Pt Education - [facility] Hemorrhoids (Safiyya Stokes): discussed with patient and provided information.  INTERNAL BLEEDING HEMORRHOIDS (K64.8)   ENCOUNTER FOR PREOPERATIVE EXAMINATION FOR GENERAL SURGICAL PROCEDURE (Z01.818)  Current Plans Pt Education - [facility] Rectal Prep for Anorectal outpatient/office surgery: discussed with patient and provided information. Pt Education - [facility] Rectal Surgery HCI (Caedmon Louque): discussed with patient and provided information.  Timothy Hector, MD, FACS, MASCRS Gastrointestinal and Minimally Invasive Surgery    [address], [employer] [place], [place] [address] [phone] Main / Paging [phone] Fax

## 2019-05-11 ENCOUNTER — Telehealth: Payer: Self-pay | Admitting: Family

## 2019-05-11 ENCOUNTER — Inpatient Hospital Stay: Payer: No Typology Code available for payment source | Attending: Family

## 2019-05-11 ENCOUNTER — Other Ambulatory Visit: Payer: Self-pay

## 2019-05-11 ENCOUNTER — Other Ambulatory Visit: Payer: Self-pay | Admitting: Family

## 2019-05-11 ENCOUNTER — Inpatient Hospital Stay: Payer: No Typology Code available for payment source | Admitting: Family

## 2019-05-11 DIAGNOSIS — D508 Other iron deficiency anemias: Secondary | ICD-10-CM

## 2019-05-11 DIAGNOSIS — D509 Iron deficiency anemia, unspecified: Secondary | ICD-10-CM

## 2019-05-11 LAB — CBC WITH DIFFERENTIAL (CANCER CENTER ONLY)
Abs Immature Granulocytes: 0.02 10*3/uL (ref 0.00–0.07)
Basophils Absolute: 0 10*3/uL (ref 0.0–0.1)
Basophils Relative: 1 %
Eosinophils Absolute: 0 10*3/uL (ref 0.0–0.5)
Eosinophils Relative: 1 %
HCT: 36.5 % — ABNORMAL LOW (ref 39.0–52.0)
Hemoglobin: 11.5 g/dL — ABNORMAL LOW (ref 13.0–17.0)
Immature Granulocytes: 0 %
Lymphocytes Relative: 22 %
Lymphs Abs: 1.1 10*3/uL (ref 0.7–4.0)
MCH: 26.2 pg (ref 26.0–34.0)
MCHC: 31.5 g/dL (ref 30.0–36.0)
MCV: 83.1 fL (ref 80.0–100.0)
Monocytes Absolute: 0.5 10*3/uL (ref 0.1–1.0)
Monocytes Relative: 11 %
Neutro Abs: 3.2 10*3/uL (ref 1.7–7.7)
Neutrophils Relative %: 65 %
Platelet Count: 279 10*3/uL (ref 150–400)
RBC: 4.39 MIL/uL (ref 4.22–5.81)
WBC Count: 5 10*3/uL (ref 4.0–10.5)
nRBC: 0 % (ref 0.0–0.2)

## 2019-05-11 LAB — RETICULOCYTES: RBC.: 4.42 MIL/uL (ref 4.22–5.81)

## 2019-05-11 NOTE — Telephone Encounter (Signed)
Called and LMVM for patient with date/time of follow up appointments per 7/8 los

## 2019-05-11 NOTE — Progress Notes (Signed)
Hematology and Oncology Follow Up Visit  Timothy Hoffman [id number] 1961-06-04 58 y.o. 05/11/2019   Principle Diagnosis:  Iron deficiency anemia   Current Therapy:   IV iron as indicated    Interim History:  Timothy Hoffman is here today for follow-up. He is doing well physically but is stressed as his dog is at the vet and likely passing away.  He is feeling much better since having his iron infusion in June.  He has not noted a lot of rectal blood loss lately. He is scheduled for hemorrhoidectomy in August.  No bruising or petechiae.   No fever, chills, n/v, cough, rash, dizziness, SOB, chest pain, palpitations, abdominal pain or changes in bowel or bladder habits.  No swelling, tenderness, numbness or tingling in his extremities.  He is eating well and staying hydrated. His weight is stable.   ECOG Performance Status: 0 - Asymptomatic  Medications:  Allergies as of 05/11/2019      Reactions   Rose Hips [ascorbate] Itching, Rash   Tequin Rash      Medication List       Accurate as of May 11, 2019  1:26 PM. If you have any questions, ask your nurse or doctor.        ezetimibe 10 MG tablet Commonly known as: ZETIA Take 1 tablet (10 mg total) by mouth daily.   folic acid 1 MG tablet Commonly known as: FOLVITE Take 1 tablet (1 mg total) by mouth daily.   rosuvastatin 40 MG tablet Commonly known as: CRESTOR Take 1 tablet (40 mg total) by mouth daily.   sertraline 25 MG tablet Commonly known as: ZOLOFT TAKE 1 TABLET BY MOUTH DAILY.   triamcinolone cream 0.1 % Commonly known as: KENALOG Apply topically daily.       Allergies:  Allergies  Allergen Reactions  . Rose Hips [Ascorbate] Itching and Rash  . Tequin Rash    Past Medical History, Surgical history, Social history, and Family History were reviewed and updated.  Review of Systems: All other 10 point review of systems is negative.   Physical Exam:  vitals were not taken for this visit.   Wt Readings from  Last 3 Encounters:  04/04/19 188 lb 12.8 oz (85.6 kg)  09/06/18 181 lb (82.1 kg)  07/24/17 186 lb (84.4 kg)    Ocular: Sclerae unicteric, pupils equal, round and reactive to light Ear-nose-throat: Oropharynx clear, dentition fair Lymphatic: No cervical or supraclavicular adenopathy Lungs no rales or rhonchi, good excursion bilaterally Heart regular rate and rhythm, no murmur appreciated Abd soft, nontender, positive bowel sounds, no liver or spleen tip palpated on exam, no fluid wave  MSK no focal spinal tenderness, no joint edema Neuro: non-focal, well-oriented, appropriate affect Breasts: Deferred   Lab Results  Component Value Date   WBC 4.4 04/04/2019   HGB 8.7 (L) 04/04/2019   HCT 30.6 (L) 04/04/2019   MCV 70.7 (L) 04/04/2019   PLT 334 04/04/2019   Lab Results  Component Value Date   FERRITIN <4 (L) 04/04/2019   IRON 63 04/04/2019   TIBC 515 (H) 04/04/2019   UIBC 453 (H) 04/04/2019   IRONPCTSAT 12 (L) 04/04/2019   Lab Results  Component Value Date   RETICCTPCT 1.9 04/04/2019   RBC 4.29 04/04/2019   RETICCTABS 64,680 03/15/2019   No results found for: KPAFRELGTCHN, LAMBDASER, KAPLAMBRATIO No results found for: IGGSERUM, IGA, IGMSERUM No results found for: TOTALPROTELP, ALBUMINELP, A1GS, A2GS, BETS, BETA2SER, [place], [place], SPEI   Chemistry  Component Value Date/Time   NA 141 04/04/2019 1327   NA 140 07/24/2017 1554   K 4.6 04/04/2019 1327   CL 105 04/04/2019 1327   CO2 27 04/04/2019 1327   BUN 20 04/04/2019 1327   BUN 26 (H) 07/24/2017 1554   CREATININE 0.86 04/04/2019 1327   CREATININE 0.94 06/24/2016 1223      Component Value Date/Time   CALCIUM 9.0 04/04/2019 1327   ALKPHOS 57 04/04/2019 1327   AST 27 04/04/2019 1327   ALT 22 04/04/2019 1327   BILITOT 0.4 04/04/2019 1327       Impression and Plan: Timothy Hoffman is a very pleasant 58 yo caucasian gentleman with iron deficiency anemia. He has responded nicely to IV iron and Hgb is now 11.5 with MCV  83.  We will see what his iron studies show and bring him back in for infusion if needed.  We will plan to see him back in another 3 months.  He will contact our office with any questions or concerns. We can certainly see him sooner if needed.   Laverna Peace, NP 7/8/20201:26 PM

## 2019-05-12 LAB — FERRITIN: Ferritin: 25 ng/mL (ref 24–336)

## 2019-05-12 LAB — IRON AND TIBC
Iron: 57 ug/dL (ref 42–163)
Saturation Ratios: 13 % — ABNORMAL LOW (ref 20–55)
TIBC: 436 ug/dL — ABNORMAL HIGH (ref 202–409)
UIBC: 379 ug/dL — ABNORMAL HIGH (ref 117–376)

## 2019-05-17 MED FILL — FOLIC ACID 1 MG TABS: 1 | 30 days supply | Qty: 30 | Fill #1

## 2019-05-18 ENCOUNTER — Other Ambulatory Visit: Payer: Self-pay

## 2019-05-18 ENCOUNTER — Inpatient Hospital Stay: Payer: No Typology Code available for payment source

## 2019-05-18 VITALS — BP 147/84 | HR 84 | Temp 98.6°F | Resp 18

## 2019-05-18 DIAGNOSIS — D508 Other iron deficiency anemias: Secondary | ICD-10-CM

## 2019-05-18 DIAGNOSIS — D509 Iron deficiency anemia, unspecified: Secondary | ICD-10-CM | POA: Diagnosis not present

## 2019-05-18 MED ORDER — SODIUM CHLORIDE 0.9 % IV SOLN
INTRAVENOUS | Status: DC
  Administered 2019-05-18: 13:00:00 via INTRAVENOUS
  Filled 2019-05-18: qty 250

## 2019-05-18 MED ORDER — SODIUM CHLORIDE 0.9 % IV SOLN
510.0000 mg | Freq: Once | INTRAVENOUS | Status: AC
  Administered 2019-05-18: 510 mg via INTRAVENOUS
  Filled 2019-05-18: qty 510

## 2019-05-18 NOTE — Patient Instructions (Signed)

## 2019-06-10 MED FILL — EZETIMIBE 10 MG TABS: 10 | 90 days supply | Qty: 90 | Fill #0

## 2019-06-10 MED FILL — ROSUVASTATIN CALCIUM 40 MG: 40 | 90 days supply | Qty: 90 | Fill #0

## 2019-06-17 MED FILL — SERTRALINE HCL 25 MG TABLET: 25 | 90 days supply | Qty: 90 | Fill #0

## 2019-06-17 MED FILL — FOLIC ACID 1 MG TABS: 1 | 30 days supply | Qty: 30 | Fill #2

## 2019-06-25 ENCOUNTER — Other Ambulatory Visit (HOSPITAL_COMMUNITY): Payer: No Typology Code available for payment source

## 2019-07-04 ENCOUNTER — Other Ambulatory Visit (HOSPITAL_COMMUNITY)
Admission: RE | Admit: 2019-07-04 | Discharge: 2019-07-04 | Disposition: A | Discharge status: Home / Self Care | Payer: No Typology Code available for payment source | Source: Ambulatory Visit | Attending: Surgery | Admitting: Surgery

## 2019-07-04 DIAGNOSIS — K642 Third degree hemorrhoids: Secondary | ICD-10-CM | POA: Diagnosis not present

## 2019-07-04 DIAGNOSIS — Z20828 Contact with and (suspected) exposure to other viral communicable diseases: Secondary | ICD-10-CM | POA: Insufficient documentation

## 2019-07-04 DIAGNOSIS — Z01812 Encounter for preprocedural laboratory examination: Secondary | ICD-10-CM | POA: Insufficient documentation

## 2019-07-05 ENCOUNTER — Encounter (HOSPITAL_COMMUNITY): Payer: Self-pay

## 2019-07-05 ENCOUNTER — Ambulatory Visit: Payer: Self-pay | Admitting: Surgery

## 2019-07-05 LAB — SARS CORONAVIRUS 2 (TAT 6-24 HRS): SARS Coronavirus 2: NEGATIVE

## 2019-07-05 NOTE — Patient Instructions (Signed)
DUE TO COVID-19 ONLY ONE VISITOR IS ALLOWED TO COME WITH YOU AND STAY IN THE WAITING ROOM ONLY DURING PRE OP AND PROCEDURE. THE ONE VISITOR MAY VISIT WITH YOU IN YOUR PRIVATE ROOM DURING VISITING HOURS ONLY!!   COVID SWAB TESTING COMPLETED ON: Aug. 31, 2020 (Must self quarantine after testing. Follow instructions on handout.)             Your procedure is scheduled on: Thursday, Sept. 3, 2020   Report to [facility] Main  Entrance    Report to admitting at 11:15 AM   Call this number if you have problems the morning of surgery [phone]   Rectal Prep:  DAY PRIOR TO SURGERY:    Switch to drinking liquids or pureed foods only    Drink plenty of liquids all day to avoid getting dehydrated    CLEAR LIQUID DIET  Foods Allowed                                                                     Foods Excluded  Water, Black Coffee and tea, regular and decaf                             liquids that you cannot  Plain Jell-O in any flavor  (No red)                                           see through such as: Fruit ices (not with fruit pulp)                                     milk, soups, orange juice  Iced Popsicles (No red)                                    All solid food Carbonated beverages, regular and diet                                    Apple juices Sports drinks like Gatorade (No red) Lightly seasoned clear broth or consume(fat free) Sugar, honey syrup  Sample Menu Breakfast                                Lunch                                     Supper Cranberry juice                    Beef broth                            Chicken broth Jell-O  Grape juice                           Apple juice Coffee or tea                        Jell-O                                      Popsicle                                                Coffee or tea                        Coffee or tea   10:00am: Take 2 oz (4 tablespoons) Milk of  Magnesia.    2:00pm: Take 2 oz (4 tablespoons) Milk of Magnesia.    Midnight: Do not eat anything solid after bedtime (midnight) the night before your surgery. BUT DO drink plenty of clear liquids (Water, Gatorade, juice, soda, coffee, tea, broths, etc.) until 10:15AM  prior to surgery to  avoid getting dehydrated.    Brush your teeth the morning of surgery.   Do NOT smoke after Midnight   Take these medicines the morning of surgery with A SIP OF WATER: Sertraline, Loratadine if needed                               You may not have any metal on your body including jewelry, and body piercings             Do not wear lotions, powders, perfumes/cologne, or deodorant                          Men may shave face and neck.   Do not bring valuables to the hospital. [place].   Contacts, dentures or bridgework may not be worn into surgery.    Patients discharged the day of surgery will not be allowed to drive home.   Special Instructions: Bring a copy of your healthcare power of attorney and living will documents         the day of surgery if you haven't scanned them in before.              Please read over the following fact sheets you were given:  [facility] - Preparing for Surgery Before surgery, you can play an important role.  Because skin is not sterile, your skin needs to be as free of germs as possible.  You can reduce the number of germs on your skin by washing with CHG (chlorahexidine gluconate) soap before surgery.  CHG is an antiseptic cleaner which kills germs and bonds with the skin to continue killing germs even after washing. Please DO NOT use if you have an allergy to CHG or antibacterial soaps.  If your skin becomes reddened/irritated stop using the CHG and inform your nurse when you arrive at Short Stay. Do not shave (including legs and underarms) for  at least 48 hours prior to the first CHG shower.  You may shave your  face/neck.  Please follow these instructions carefully:  1.  Shower with CHG Soap the night before surgery and the  morning of surgery.  2.  If you choose to wash your hair, wash your hair first as usual with your normal  shampoo.  3.  After you shampoo, rinse your hair and body thoroughly to remove the shampoo.                             4.  Use CHG as you would any other liquid soap.  You can apply chg directly to the skin and wash.  Gently with a scrungie or clean washcloth.  5.  Apply the CHG Soap to your body ONLY FROM THE NECK DOWN.   Do   not use on face/ open                           Wound or open sores. Avoid contact with eyes, ears mouth and   genitals (private parts).                       Wash face,  Genitals (private parts) with your normal soap.             6.  Wash thoroughly, paying special attention to the area where your    surgery  will be performed.  7.  Thoroughly rinse your body with warm water from the neck down.  8.  DO NOT shower/wash with your normal soap after using and rinsing off the CHG Soap.                9.  Pat yourself dry with a clean towel.            10.  Wear clean pajamas.            11.  Place clean sheets on your bed the night of your first shower and do not  sleep with pets. Day of Surgery : Do not apply any lotions/deodorants the morning of surgery.  Please wear clean clothes to the hospital/surgery center.  FAILURE TO FOLLOW THESE INSTRUCTIONS MAY RESULT IN THE CANCELLATION OF YOUR SURGERY  PATIENT SIGNATURE_________________________________  NURSE SIGNATURE__________________________________  ________________________________________________________________________

## 2019-07-06 ENCOUNTER — Encounter (HOSPITAL_COMMUNITY)
Admission: RE | Admit: 2019-07-06 | Discharge: 2019-07-06 | Disposition: A | Discharge status: Home / Self Care | Payer: No Typology Code available for payment source | Source: Ambulatory Visit | Attending: Surgery | Admitting: Surgery

## 2019-07-06 ENCOUNTER — Encounter (HOSPITAL_COMMUNITY): Payer: Self-pay

## 2019-07-06 ENCOUNTER — Other Ambulatory Visit: Payer: Self-pay

## 2019-07-06 DIAGNOSIS — E785 Hyperlipidemia, unspecified: Secondary | ICD-10-CM | POA: Diagnosis not present

## 2019-07-06 DIAGNOSIS — K625 Hemorrhage of anus and rectum: Secondary | ICD-10-CM | POA: Diagnosis not present

## 2019-07-06 DIAGNOSIS — K644 Residual hemorrhoidal skin tags: Secondary | ICD-10-CM | POA: Diagnosis not present

## 2019-07-06 DIAGNOSIS — D509 Iron deficiency anemia, unspecified: Secondary | ICD-10-CM | POA: Diagnosis not present

## 2019-07-06 DIAGNOSIS — Z79899 Other long term (current) drug therapy: Secondary | ICD-10-CM | POA: Diagnosis not present

## 2019-07-06 DIAGNOSIS — K642 Third degree hemorrhoids: Secondary | ICD-10-CM | POA: Diagnosis not present

## 2019-07-06 DIAGNOSIS — Z8719 Personal history of other diseases of the digestive system: Secondary | ICD-10-CM | POA: Diagnosis not present

## 2019-07-06 DIAGNOSIS — Z881 Allergy status to other antibiotic agents status: Secondary | ICD-10-CM | POA: Diagnosis not present

## 2019-07-06 DIAGNOSIS — K641 Second degree hemorrhoids: Secondary | ICD-10-CM | POA: Diagnosis not present

## 2019-07-06 HISTORY — DX: Nausea with vomiting, unspecified: R11.2

## 2019-07-06 HISTORY — DX: Diverticulosis of intestine, part unspecified, without perforation or abscess without bleeding: K57.90

## 2019-07-06 HISTORY — DX: Personal history of colonic polyps: Z86.010

## 2019-07-06 HISTORY — DX: Other specified postprocedural states: Z98.890

## 2019-07-06 HISTORY — DX: Personal history of colon polyps, unspecified: Z86.0100

## 2019-07-06 LAB — CBC
HCT: 37.2 % — ABNORMAL LOW (ref 39.0–52.0)
Hemoglobin: 11.7 g/dL — ABNORMAL LOW (ref 13.0–17.0)
MCH: 27.9 pg (ref 26.0–34.0)
MCHC: 31.5 g/dL (ref 30.0–36.0)
MCV: 88.6 fL (ref 80.0–100.0)
Platelets: 311 10*3/uL (ref 150–400)
RBC: 4.2 MIL/uL — ABNORMAL LOW (ref 4.22–5.81)
RDW: 17.6 % — ABNORMAL HIGH (ref 11.5–15.5)
WBC: 5.4 10*3/uL (ref 4.0–10.5)
nRBC: 0 % (ref 0.0–0.2)

## 2019-07-06 MED ORDER — BUPIVACAINE LIPOSOME 1.3 % IJ SUSP
20.0000 mL | INTRAMUSCULAR | Status: DC
  Filled 2019-07-06: qty 20

## 2019-07-06 NOTE — Progress Notes (Signed)
PCP - Dr. Frederik Pear Cardiologist - N/A  Chest x-ray - N/A EKG - N/A Stress Test - N/A ECHO - N/A Cardiac Cath - N/A  Sleep Study - N/A CPAP - N/A  Fasting Blood Sugar - N/A Checks Blood Sugar _N/A____ times a day  Blood Thinner Instructions:  N/A Aspirin Instructions: N/A Last Dose: N/A   Anesthesia review:  N/A  Patient denies shortness of breath, fever, cough and chest pain at PAT appointment   Patient verbalized understanding of instructions that were given to them at the PAT appointment. Patient was also instructed that they will need to review over the PAT instructions again at home before surgery.

## 2019-07-06 NOTE — Progress Notes (Signed)
SPOKE W/  [place]     SCREENING SYMPTOMS OF COVID 19:   COUGH--NO  RUNNY NOSE--- NO  SORE THROAT---NO  NASAL CONGESTION----NO  SNEEZING----NO  SHORTNESS OF BREATH---NO  DIFFICULTY BREATHING---NO  TEMP >100.0 -----NO  UNEXPLAINED BODY ACHES------NO  CHILLS -------- NO  HEADACHES ---------NO  LOSS OF SMELL/ TASTE --------NO    HAVE YOU OR ANY FAMILY MEMBER TRAVELLED PAST 14 DAYS OUT OF THE   COUNTY--- Travled to [facility] STATE----NO COUNTRY----NO  HAVE YOU OR ANY FAMILY MEMBER BEEN EXPOSED TO ANYONE WITH COVID 19? NO

## 2019-07-07 ENCOUNTER — Encounter (HOSPITAL_COMMUNITY): Payer: Self-pay | Admitting: General Practice

## 2019-07-07 ENCOUNTER — Ambulatory Visit (HOSPITAL_COMMUNITY)
Admission: RE | Admit: 2019-07-07 | Discharge: 2019-07-07 | Disposition: A | Discharge status: Home / Self Care | Payer: No Typology Code available for payment source | Attending: Surgery | Admitting: Surgery

## 2019-07-07 ENCOUNTER — Telehealth (HOSPITAL_COMMUNITY): Payer: Self-pay | Admitting: *Deleted

## 2019-07-07 ENCOUNTER — Ambulatory Visit (HOSPITAL_COMMUNITY): Payer: No Typology Code available for payment source | Admitting: Registered Nurse

## 2019-07-07 ENCOUNTER — Encounter (HOSPITAL_COMMUNITY)
Admission: RE | Disposition: A | Discharge status: Home / Self Care | Payer: Self-pay | Source: Home / Self Care | Attending: Surgery

## 2019-07-07 DIAGNOSIS — K641 Second degree hemorrhoids: Secondary | ICD-10-CM | POA: Insufficient documentation

## 2019-07-07 DIAGNOSIS — K642 Third degree hemorrhoids: Secondary | ICD-10-CM | POA: Insufficient documentation

## 2019-07-07 DIAGNOSIS — K644 Residual hemorrhoidal skin tags: Secondary | ICD-10-CM | POA: Insufficient documentation

## 2019-07-07 DIAGNOSIS — K625 Hemorrhage of anus and rectum: Secondary | ICD-10-CM | POA: Insufficient documentation

## 2019-07-07 DIAGNOSIS — Z881 Allergy status to other antibiotic agents status: Secondary | ICD-10-CM | POA: Insufficient documentation

## 2019-07-07 DIAGNOSIS — E785 Hyperlipidemia, unspecified: Secondary | ICD-10-CM | POA: Insufficient documentation

## 2019-07-07 DIAGNOSIS — Z8719 Personal history of other diseases of the digestive system: Secondary | ICD-10-CM | POA: Insufficient documentation

## 2019-07-07 DIAGNOSIS — D509 Iron deficiency anemia, unspecified: Secondary | ICD-10-CM | POA: Insufficient documentation

## 2019-07-07 DIAGNOSIS — Z79899 Other long term (current) drug therapy: Secondary | ICD-10-CM | POA: Insufficient documentation

## 2019-07-07 HISTORY — PX: EVALUATION UNDER ANESTHESIA WITH HEMORRHOIDECTOMY: SHX5624

## 2019-07-07 SURGERY — EXAM UNDER ANESTHESIA WITH HEMORRHOIDECTOMY
Anesthesia: General | Site: Rectum

## 2019-07-07 MED ORDER — FENTANYL CITRATE (PF) 100 MCG/2ML IJ SOLN
INTRAMUSCULAR | Status: AC
  Filled 2019-07-07: qty 2

## 2019-07-07 MED ORDER — CEFAZOLIN SODIUM-DEXTROSE 2-4 GM/100ML-% IV SOLN
2.0000 g | INTRAVENOUS | Status: AC
  Administered 2019-07-07: 12:00:00 2 g via INTRAVENOUS
  Filled 2019-07-07: qty 100

## 2019-07-07 MED ORDER — ROCURONIUM BROMIDE 10 MG/ML (PF) SYRINGE
PREFILLED_SYRINGE | INTRAVENOUS | Status: AC
  Filled 2019-07-07: qty 10

## 2019-07-07 MED ORDER — DIBUCAINE (PERIANAL) 1 % EX OINT
TOPICAL_OINTMENT | CUTANEOUS | Status: AC
  Filled 2019-07-07: qty 28

## 2019-07-07 MED ORDER — ONDANSETRON HCL 4 MG/2ML IJ SOLN
INTRAMUSCULAR | Status: AC
  Filled 2019-07-07: qty 2

## 2019-07-07 MED ORDER — GABAPENTIN 300 MG PO CAPS
300.0000 mg | ORAL_CAPSULE | ORAL | Status: AC
  Administered 2019-07-07: 300 mg via ORAL
  Filled 2019-07-07: qty 1

## 2019-07-07 MED ORDER — DEXAMETHASONE SODIUM PHOSPHATE 10 MG/ML IJ SOLN
INTRAMUSCULAR | Status: AC
  Filled 2019-07-07: qty 1

## 2019-07-07 MED ORDER — MIDAZOLAM HCL 2 MG/2ML IJ SOLN
INTRAMUSCULAR | Status: AC
  Filled 2019-07-07: qty 2

## 2019-07-07 MED ORDER — SUGAMMADEX SODIUM 200 MG/2ML IV SOLN
INTRAVENOUS | Status: DC | PRN
  Administered 2019-07-07: 200 mg via INTRAVENOUS

## 2019-07-07 MED ORDER — CELECOXIB 200 MG PO CAPS
200.0000 mg | ORAL_CAPSULE | ORAL | Status: AC
  Administered 2019-07-07: 11:00:00 200 mg via ORAL
  Filled 2019-07-07: qty 1

## 2019-07-07 MED ORDER — OXYCODONE HCL 5 MG PO TABS: 5.0000 mg | ORAL_TABLET | Freq: Once | ORAL | Status: DC | PRN

## 2019-07-07 MED ORDER — BUPIVACAINE-EPINEPHRINE 0.5% -1:200000 IJ SOLN
INTRAMUSCULAR | Status: AC
  Filled 2019-07-07: qty 1

## 2019-07-07 MED ORDER — OXYCODONE HCL 5 MG/5ML PO SOLN: 5.0000 mg | Freq: Once | ORAL | Status: DC | PRN

## 2019-07-07 MED ORDER — BUPIVACAINE LIPOSOME 1.3 % IJ SUSP
INTRAMUSCULAR | Status: DC | PRN
  Administered 2019-07-07: 20 mL

## 2019-07-07 MED ORDER — MEPERIDINE HCL 50 MG/ML IJ SOLN
6.2500 mg | INTRAMUSCULAR | Status: DC | PRN
  Administered 2019-07-07: 6.25 mg via INTRAVENOUS

## 2019-07-07 MED ORDER — METRONIDAZOLE IN NACL 5-0.79 MG/ML-% IV SOLN
500.0000 mg | INTRAVENOUS | Status: AC
  Administered 2019-07-07: 13:00:00 500 mg via INTRAVENOUS
  Filled 2019-07-07: qty 100

## 2019-07-07 MED ORDER — 0.9 % SODIUM CHLORIDE (POUR BTL) OPTIME
TOPICAL | Status: DC | PRN
  Administered 2019-07-07: 13:00:00 1000 mL

## 2019-07-07 MED ORDER — BUPIVACAINE-EPINEPHRINE 0.5% -1:200000 IJ SOLN
INTRAMUSCULAR | Status: DC | PRN
  Administered 2019-07-07: 20 mL

## 2019-07-07 MED ORDER — CHLORHEXIDINE GLUCONATE CLOTH 2 % EX PADS: 6.0000 | MEDICATED_PAD | Freq: Once | CUTANEOUS | Status: DC

## 2019-07-07 MED ORDER — PROMETHAZINE HCL 25 MG/ML IJ SOLN: 6.2500 mg | INTRAMUSCULAR | Status: DC | PRN

## 2019-07-07 MED ORDER — SCOPOLAMINE 1 MG/3DAYS TD PT72
1.0000 | MEDICATED_PATCH | Freq: Once | TRANSDERMAL | Status: DC
  Administered 2019-07-07: 1.5 mg via TRANSDERMAL
  Filled 2019-07-07: qty 1

## 2019-07-07 MED ORDER — MEPERIDINE HCL 50 MG/ML IJ SOLN
INTRAMUSCULAR | Status: AC
  Filled 2019-07-07: qty 1

## 2019-07-07 MED ORDER — FENTANYL CITRATE (PF) 100 MCG/2ML IJ SOLN
INTRAMUSCULAR | Status: DC | PRN
  Administered 2019-07-07: 100 ug via INTRAVENOUS

## 2019-07-07 MED ORDER — LIDOCAINE 2% (20 MG/ML) 5 ML SYRINGE
INTRAMUSCULAR | Status: AC
  Filled 2019-07-07: qty 5

## 2019-07-07 MED ORDER — METHYLENE BLUE 0.5 % INJ SOLN
INTRAVENOUS | Status: AC
  Filled 2019-07-07: qty 10

## 2019-07-07 MED ORDER — LIDOCAINE HCL (CARDIAC) PF 100 MG/5ML IV SOSY
PREFILLED_SYRINGE | INTRAVENOUS | Status: DC | PRN
  Administered 2019-07-07: 100 mg via INTRAVENOUS

## 2019-07-07 MED ORDER — LACTATED RINGERS IV SOLN
INTRAVENOUS | Status: DC
  Administered 2019-07-07 (×2): via INTRAVENOUS

## 2019-07-07 MED ORDER — DIBUCAINE (PERIANAL) 1 % EX OINT
TOPICAL_OINTMENT | CUTANEOUS | Status: DC | PRN
  Administered 2019-07-07: 1 via RECTAL

## 2019-07-07 MED ORDER — ONDANSETRON HCL 4 MG/2ML IJ SOLN
INTRAMUSCULAR | Status: DC | PRN
  Administered 2019-07-07: 4 mg via INTRAVENOUS

## 2019-07-07 MED ORDER — ACETAMINOPHEN 500 MG PO TABS
1000.0000 mg | ORAL_TABLET | ORAL | Status: AC
  Administered 2019-07-07: 11:00:00 1000 mg via ORAL
  Filled 2019-07-07: qty 2

## 2019-07-07 MED ORDER — PROPOFOL 10 MG/ML IV BOLUS
INTRAVENOUS | Status: AC
  Filled 2019-07-07: qty 20

## 2019-07-07 MED ORDER — ROCURONIUM BROMIDE 100 MG/10ML IV SOLN
INTRAVENOUS | Status: DC | PRN
  Administered 2019-07-07 (×2): 50 mg via INTRAVENOUS

## 2019-07-07 MED ORDER — DEXAMETHASONE SODIUM PHOSPHATE 10 MG/ML IJ SOLN
INTRAMUSCULAR | Status: DC | PRN
  Administered 2019-07-07: 10 mg via INTRAVENOUS

## 2019-07-07 MED ORDER — OXYCODONE HCL 5 MG PO TABS: 5.0000 mg | ORAL_TABLET | Freq: Four times a day (QID) | ORAL | 0 refills | Status: DC | PRN

## 2019-07-07 MED ORDER — MIDAZOLAM HCL 5 MG/5ML IJ SOLN
INTRAMUSCULAR | Status: DC | PRN
  Administered 2019-07-07: 2 mg via INTRAVENOUS

## 2019-07-07 MED ORDER — HYDROMORPHONE HCL 1 MG/ML IJ SOLN: 0.2500 mg | INTRAMUSCULAR | Status: DC | PRN

## 2019-07-07 MED ORDER — PROPOFOL 10 MG/ML IV BOLUS
INTRAVENOUS | Status: DC | PRN
  Administered 2019-07-07 (×2): 200 mg via INTRAVENOUS

## 2019-07-07 MED FILL — oxyCODONE HCL 5 MG TABS: 5 | 4 days supply | Qty: 30 | Fill #0

## 2019-07-07 SURGICAL SUPPLY — 35 items
APL SKNCLS STERI-STRIP NONHPOA (GAUZE/BANDAGES/DRESSINGS) ×1
BENZOIN TINCTURE PRP APPL 2/3 (GAUZE/BANDAGES/DRESSINGS) ×3 IMPLANT
BLADE SURG 15 STRL LF DISP TIS (BLADE) IMPLANT
BLADE SURG 15 STRL SS (BLADE)
BRIEF STRETCH FOR OB PAD LRG (UNDERPADS AND DIAPERS) ×3 IMPLANT
CONT SPEC 4OZ CLIKSEAL STRL BL (MISCELLANEOUS) ×3 IMPLANT
COVER SURGICAL LIGHT HANDLE (MISCELLANEOUS) ×3 IMPLANT
COVER WAND RF STERILE (DRAPES) IMPLANT
DECANTER SPIKE VIAL GLASS SM (MISCELLANEOUS) ×3 IMPLANT
DRAPE LAPAROTOMY T 102X78X121 (DRAPES) ×3 IMPLANT
DRSG PAD ABDOMINAL 8X10 ST (GAUZE/BANDAGES/DRESSINGS) ×2 IMPLANT
ELECT PENCIL ROCKER SW 15FT (MISCELLANEOUS) ×3 IMPLANT
ELECT REM PT RETURN 15FT ADLT (MISCELLANEOUS) ×3 IMPLANT
GAUZE 4X4 16PLY RFD (DISPOSABLE) ×3 IMPLANT
GAUZE SPONGE 4X4 12PLY STRL (GAUZE/BANDAGES/DRESSINGS) ×2 IMPLANT
GLOVE ECLIPSE 8.0 STRL XLNG CF (GLOVE) ×3 IMPLANT
GLOVE INDICATOR 8.0 STRL GRN (GLOVE) ×3 IMPLANT
GOWN STRL REUS W/TWL XL LVL3 (GOWN DISPOSABLE) ×6 IMPLANT
KIT BASIN OR (CUSTOM PROCEDURE TRAY) ×3 IMPLANT
KIT TURNOVER KIT A (KITS) IMPLANT
LOOP VESSEL MAXI BLUE (MISCELLANEOUS) IMPLANT
NEEDLE HYPO 22GX1.5 SAFETY (NEEDLE) ×3 IMPLANT
PACK BASIC VI WITH GOWN DISP (CUSTOM PROCEDURE TRAY) ×3 IMPLANT
SHEARS HARMONIC 9CM CVD (BLADE) IMPLANT
SURGILUBE 2OZ TUBE FLIPTOP (MISCELLANEOUS) ×3 IMPLANT
SUT CHROMIC 2 0 SH (SUTURE) ×3 IMPLANT
SUT CHROMIC 3 0 SH 27 (SUTURE) IMPLANT
SUT VIC AB 2-0 SH 27 (SUTURE)
SUT VIC AB 2-0 SH 27X BRD (SUTURE) IMPLANT
SUT VIC AB 2-0 UR6 27 (SUTURE) ×18 IMPLANT
SYR 20ML LL LF (SYRINGE) ×3 IMPLANT
SYR 3ML LL SCALE MARK (SYRINGE) IMPLANT
TOWEL OR 17X26 10 PK STRL BLUE (TOWEL DISPOSABLE) ×3 IMPLANT
TOWEL OR NON WOVEN STRL DISP B (DISPOSABLE) ×3 IMPLANT
YANKAUER SUCT BULB TIP 10FT TU (MISCELLANEOUS) ×3 IMPLANT

## 2019-07-07 NOTE — Op Note (Signed)
07/07/2019  1:52 PM  PATIENT:  Timothy Hoffman  58 y.o. male  Patient Care Team: Mosie Lukes, MD as PCP - General (Family Medicine) McKenzie, Candee Furbish, MD as Consulting Physician (Urology) Michael Boston, MD as Consulting Physician (General Surgery) Irene Shipper, MD as Consulting Physician (Gastroenterology)  PRE-OPERATIVE DIAGNOSIS:  HEMORRHOIDS PROLAPSING GRADE 3 WITH BLEEDING AND PAIN  POST-OPERATIVE DIAGNOSIS:  HEMORRHOIDS PROLAPSING GRADE 3 WITH BLEEDING AND PAIN  PROCEDURE:    Internal and external hemorrhoidectomy  x2 Internal hemorrhoidal ligation and pexy Anorectal examination under anesthesia  SURGEON:  Adin Hector, MD  ANESTHESIA:   General Anorectal & Local field block (0.25% bupivacaine with epinephrine mixed with Liposomal bupivacaine (Experel)   EBL:  Total I/O In: 1000 [I.V.:1000] Out: - .  See operative record  Delay start of Pharmacological VTE agent (>24hrs) due to surgical blood loss or risk of bleeding:  NO  DRAINS: NONE  SPECIMEN:   Internal & external hemorrhoidx2  DISPOSITION OF SPECIMEN:  PATHOLOGY  COUNTS:  YES  PLAN OF CARE: Discharge home after PACU  PATIENT DISPOSITION:  PACU - hemodynamically stable.  INDICATION: Pleasant patient with struggles with hemorrhoids.  Pain and bleeding.  Not able to be managed in the office despite an improved bowel regimen.  I recommended examination under anesthesia and surgical treatment:  The anatomy & physiology of the anorectal region was discussed.  The pathophysiology of hemorrhoids and differential diagnosis was discussed.  Natural history risks without surgery was discussed.   I stressed the importance of a bowel regimen to have daily soft bowel movements to minimize progression of disease.  Interventions such as sclerotherapy & banding were discussed.  The patient's symptoms are not adequately controlled by medicines and other non-operative treatments.  I feel the risks & problems of no  surgery outweigh the operative risks; therefore, I recommended surgery to treat the hemorrhoids by ligation, pexy, and possible resection.  Risks such as bleeding, infection, need for further treatment, heart attack, death, and other risks were discussed.   I noted a good likelihood this will help address the problem.  Goals of post-operative recovery were discussed as well.  Possibility that this will not correct all symptoms was explained.  Post-operative pain, bleeding, constipation, urinary difficulties, and other problems after surgery were discussed.  We will work to minimize complications.   Educational handouts further explaining the pathology, treatment options, and bowel regimen were given as well.  Questions were answered.  The patient expresses understanding & wishes to proceed with surgery.  OR FINDINGS: Enlarged internal hemorrhoids especially right posterior and right anterior piles.  Grade 3 with external component especially right posterior.  Left lateral grade 2.  Redundant rectum but no true circumferential prolapse.  DESCRIPTION:   Informed consent was confirmed. Patient underwent general anesthesia without difficulty. Patient was placed into prone positioning.  The perianal region was prepped and draped in sterile fashion. Surgical time-out confirmed our plan.  I did digital rectal examination and then transitioned over to anoscopy to get a sense of the anatomy.  Findings noted above.   I proceeded to do hemorrhoidal ligation and pexy.  I used a 2-0 Vicryl suture on a UR-6 needle in a figure-of-eight fashion 6 cm proximal to the anal verge.  I started at the largest hemorrhoid pile.  Because of redundant hemorrhoidal tissue too bulky to merely ligate or pexy, I excised the excess internal hemorrhoid piles longitudinally in a fusiform biconcave fashion, at the  right anterior  and right posterior location, sparing the anal canal to avoid narrowing.  I then ran that stitch  longitudinally more distally to close the hemorrhoidectomy wound to the anal verge over a Parks self retaining retractor & occasionally a large Hill-Furgeson retractor to avoid narrowing of the anal canal.  I then tied that stitch down to cause a hemorrhoidopexy.   I then did hemorrhoidal ligation and pexy at the other 4 columns.  At the completion of this, all 6 anorectal columns were ligated and pexied in the classic hexagonal fashion (right anterior/lateral/posterior, left anterior/lateral/posterior).  I closed the external part of the hemorrhoidectomy wounds with interrupted horizontal mattress 2-0 chromic suture, leaving the last 5 mm open to allow natural drainage.  I did have to excise more external hemorrhoid tissue in the right posterior pile and have a more flat region  I redid anoscopy & examination.  At completion of this, all hemorrhoids had been removed or reduced into the rectum.  There was still some redundancy in the posterior rectum.  I did some figure-of-eight 2-0 Vicryl sutures to help get that to come down and no longer wish to prolapse out.  After that and reexamination hemostasis was good.  There is no more prolapse.  Internal & external anatomy was more more normal.  Hemostasis was good.  Fluffed gauze was on-laid over the perianal region.  No packing done.  Patient is being extubated go to go to the recovery room.  I had discussed postop care in detail with the patient in the preop holding area.  Instructions for post-operative recovery and prescriptions are written. I discussed operative findings, updated the patient's status, discussed probable steps to recovery, and gave postoperative recommendations to the patient's spouse.  Recommendations were made.  Questions were answered.  She expressed understanding & appreciation.  Adin Hector, M.D., F.A.C.S. Gastrointestinal and Minimally Invasive Surgery [facility] [address] [place], [place]  [id number] [phone] Main / Paging

## 2019-07-07 NOTE — H&P (Signed)
Timothy Hoffman  DOB: [birth year]  Patient Care Team: Mosie Lukes, MD as PCP - General (Family Medicine) Alyson Ingles Candee Furbish, MD as Consulting Physician (Urology) Michael Boston, MD as Consulting Physician (General Surgery) Irene Shipper, MD as Consulting Physician (Gastroenterology)  ` Patient sent for surgical consultation at the request of Dr Grandville Silos  Chief Complaint: Persistent hemorrhoid problems. Consideration of surgery. ` ` The patient is a pleasant gentleman with history of colon polyps. Last colonoscopy by Dr. Scarlette Shorts with [facility] 2018 with acute or adenoma. His had some iron deficiency anemia and intermittent bleeding. Found to have symptomatically hemorrhoid that was banded. Has recurrent symptoms. Wished to consider surgery. Referred to myself to consider hemorrhoidectomy. Patient's had some persistent anemia iron deficiency. He is recently got some iron infusions from Dr. Romeo Apple on an oral iron supplement. He notes blood when he wipes and on paper. He usually moves his bowels about twice a day without a Biber fiber supplement. No cardiac nor pulmonary issues. No smoking tobacco. He usually can walk several miles without difficulty. She gets a little tired after jogging, which makes him wonder if he has symptomatically anemia  No personal nor family history of GI/colon cancer, inflammatory bowel disease, irritable bowel syndrome, allergy such as Celiac Sprue, dietary/dairy problems, colitis, ulcers nor gastritis. No recent sick contacts/gastroenteritis. No travel outside the country. No changes in diet. No dysphagia to solids or liquids. No significant heartburn or reflux. No hematemesis, coffee ground emesis. No evidence of prior gastric/peptic ulceration.  Still w BRBPR.  Ready for surgery  (Review of systems as stated in this history (HPI) or in the review of systems. Otherwise all other 12 point ROS are negative) ` ` `    Allergies (Sabrina Canty, CMA; 04/27/2019 8:46 AM) Tequin *FLUOROQUINOLONES* Allergies Reconciled  Medication History (Sabrina Canty, CMA; 04/27/2019 8:46 AM) Rosuvastatin Calcium (40MG  Tablet, Oral) Active. Sertraline HCl (25MG  Tablet, Oral) Active. Zetia (10MG  Tablet, Oral) Active. Folic Acid (1MG  Tablet, Oral) Active. Medications Reconciled    Vitals (Sabrina Canty CMA; 04/27/2019 8:47 AM) 04/27/2019 8:46 AM Weight: 187.38 lb Height: 70in Body Surface Area: 2.03 m Body Mass Index: 26.89 kg/m  Temp.: 99.28F(Oral)  Pulse: 97 (Regular)  BP: 172/90 (Sitting, Left Arm, Standard)    07/07/2019 BP (!) 154/106   Pulse 92   Temp 98.8 F (37.1 C) (Oral)   Resp 18   Ht 5\' 10"  (1.778 m)   Wt 85.4 kg   SpO2 98%   BMI 27.00 kg/m      Physical Exam Adin Hector MD; 04/27/2019 5:40 PM)  General Mental Status-Alert. General Appearance-Not in acute distress, Not Sickly. Orientation-Oriented X3. Hydration-Well hydrated. Voice-Normal.  Integumentary Global Assessment Upon inspection and palpation of skin surfaces of the - Axillae: non-tender, no inflammation or ulceration, no drainage. and Distribution of scalp and body hair is normal. General Characteristics Temperature - normal warmth is noted.  Head and Neck Head-normocephalic, atraumatic with no lesions or palpable masses. Face Global Assessment - atraumatic, no absence of expression. Neck Global Assessment - no abnormal movements, no bruit auscultated on the right, no bruit auscultated on the left, no decreased range of motion, non-tender. Trachea-midline. Thyroid Gland Characteristics - non-tender.  Eye Eyeball - Left-Extraocular movements intact, No Nystagmus - Left. Eyeball - Right-Extraocular movements intact, No Nystagmus - Right. Cornea - Left-No Hazy - Left. Cornea - Right-No Hazy - Right. Sclera/Conjunctiva - Left-No scleral icterus, No  Discharge - Left. Sclera/Conjunctiva - Right-No scleral icterus, No  Discharge - Right. Pupil - Left-Direct reaction to light normal. Pupil - Right-Direct reaction to light normal. Note: Wears glasses. Vision corrected  ENMT Ears Pinna - Left - no drainage observed, no generalized tenderness observed. Pinna - Right - no drainage observed, no generalized tenderness observed. Nose and Sinuses External Inspection of the Nose - no destructive lesion observed. Inspection of the nares - Left - quiet respiration. Inspection of the nares - Right - quiet respiration. Mouth and Throat Lips - Upper Lip - no fissures observed, no pallor noted. Lower Lip - no fissures observed, no pallor noted. Nasopharynx - no discharge present. Oral Cavity/Oropharynx - Tongue - no dryness observed. Oral Mucosa - no cyanosis observed. Hypopharynx - no evidence of airway distress observed.  Chest and Lung Exam Inspection Movements - Normal and Symmetrical. Accessory muscles - No use of accessory muscles in breathing. Palpation Palpation of the chest reveals - Non-tender. Auscultation Breath sounds - Normal and Clear.  Cardiovascular Auscultation Rhythm - Regular. Murmurs & Other Heart Sounds - Auscultation of the heart reveals - No Murmurs and No Systolic Clicks.  Abdomen Inspection Inspection of the abdomen reveals - No Visible peristalsis and No Abnormal pulsations. Umbilicus - No Bleeding, No Urine drainage. Palpation/Percussion Palpation and Percussion of the abdomen reveal - Soft, Non Tender, No Rebound tenderness, No Rigidity (guarding) and No Cutaneous hyperesthesia. Note: Abdomen flat and soft. Nontender. Not distended. Mild suprapubic umbilical diastases recti.No umbilical or incisional hernias. No guarding.  Male Genitourinary Sexual Maturity Tanner 5 - Adult hair pattern and Adult penile size and shape. Note: No inguinal hernias. Normal external genitalia. Epididymi, testes, and  spermatic cords normal without any masses.  Rectal Note: Please refer to the anoscopy.  Right posterior grade 3 enlarged friable hemorrhoid easily prolapses out. Easily bleeds. Right anterior and left lateral at least grade 2 and somewhat inflamed.  Mild perirectal pleuritis with some external hemorrhoidal tags. No fissure. No fistula. No abscess. Prostate smooth without enlargement or nodularity. No rectal masses felt to 6 cm. No stool in vault.  Peripheral Vascular Upper Extremity Inspection - Left - No Cyanotic nailbeds - Left, Not Ischemic. Inspection - Right - No Cyanotic nailbeds - Right, Not Ischemic.  Neurologic Neurologic evaluation reveals -normal attention span and ability to concentrate, able to name objects and repeat phrases. Appropriate fund of knowledge , normal sensation and normal coordination. Mental Status Affect - not angry, not paranoid. Cranial Nerves-Normal Bilaterally. Gait-Normal.  Neuropsychiatric Mental status exam performed with findings of-able to articulate well with normal speech/language, rate, volume and coherence, thought content normal with ability to perform basic computations and apply abstract reasoning and no evidence of hallucinations, delusions, obsessions or homicidal/suicidal ideation.  Musculoskeletal Global Assessment Spine, Ribs and Pelvis - no instability, subluxation or laxity. Right Upper Extremity - no instability, subluxation or laxity.  Lymphatic Head & Neck  General Head & Neck Lymphatics: Bilateral - Description - No Localized lymphadenopathy. Axillary  General Axillary Region: Bilateral - Description - No Localized lymphadenopathy. Femoral & Inguinal  Generalized Femoral & Inguinal Lymphatics: Left - Description - No Localized lymphadenopathy. Right - Description - No Localized lymphadenopathy.   Results Adin Hector MD; 04/27/2019 5:41 PM) Procedures  Name Value Date Hemorrhoids  Procedure Anal exam: External Hemorrhoid Internal exam: Internal Hemorroids ( non-bleeding) Internal Hemorrhoids (bleeding) prolapse Other: Right posterior grade 3 enlarged friable hemorrhoid easily prolapses out. Easily bleeds. Right anterior and left lateral at least grade 2 and somewhat inflamed.............Mild perirectal pleuritis with some external hemorrhoidal  tags. No fissure. No fistula. No abscess. Prostate smooth without enlargement or nodularity. No rectal masses felt to 6 cm. No stool in vault.      Assessment & Plan   PROLAPSED INTERNAL HEMORRHOIDS, GRADE 3 (K64.2) Impression: Persistent bleeding hemorrhoids. At least one prolapsing. Friable and irritated. I think he would benefit from hemorrhoidal ligations less pexy. Probable hemorrhoidectomy of the obvious inflamed bleeding hemorrhoid. Outpatient surgery. He is interested in proceeding.  Should he have persistent anemia iron deficiency after hemorrhoid surgery > 3 months out, consider reevaluation by gastroenterology to rule out other etiologies.   The anatomy & physiology of the anorectal region was discussed. The pathophysiology of hemorrhoids and differential diagnosis was discussed. Natural history risks without surgery was discussed. I stressed the importance of a bowel regimen to have daily soft bowel movements to minimize progression of disease. Interventions such as sclerotherapy & banding were discussed.  The patient's symptoms are not adequately controlled by medicines and other non-operative treatments. I feel the risks & problems of no surgery outweigh the operative risks; therefore, I recommended surgery to treat the hemorrhoids by ligation, pexy, and possible resection.  Risks such as bleeding, infection, urinary difficulties, need for further treatment, heart attack, death, and other risks were discussed. I noted a good likelihood this will help address the problem.  Goals of post-operative recovery were discussed as well. Possibility that this will not correct all symptoms was explained. Post-operative pain, bleeding, constipation, and other problems after surgery were discussed. We will work to minimize complications. Educational handouts further explaining the pathology, treatment options, and bowel regimen were given as well. Questions were answered. The patient expresses understanding & wishes to proceed with surgery.    Adin Hector, MD, FACS, MASCRS Gastrointestinal and Minimally Invasive Surgery    [address], [place], [place] [address] [phone] Main / Paging [phone] Fax

## 2019-07-07 NOTE — Anesthesia Postprocedure Evaluation (Signed)
Anesthesia Post Note  Patient: Timothy Hoffman  Procedure(s) Performed: HEMORRHOIDECTOMY, HEMORRHOIDAL LIGATION/PEXY ANORECTAL EXAM UNDER ANESTHESIA (N/A Rectum)     Patient location during evaluation: PACU Anesthesia Type: General Level of consciousness: sedated and patient cooperative Pain management: pain level controlled Vital Signs Assessment: post-procedure vital signs reviewed and stable Respiratory status: spontaneous breathing Cardiovascular status: stable Anesthetic complications: no    Last Vitals:  Vitals:   07/07/19 1430 07/07/19 1445  BP: 135/86 (!) 138/95  Pulse: 68 61  Resp: 19 18  Temp:  36.9 C  SpO2: 96% 100%    Last Pain:  Vitals:   07/07/19 1445  TempSrc:   PainSc: 0-No pain                 Nolon Nations

## 2019-07-07 NOTE — Discharge Instructions (Signed)
ANORECTAL SURGERY:  POST OPERATIVE INSTRUCTIONS  ######################################################################  EAT Start with a pureed / full liquid diet After 24 hours, gradually transition to a high fiber diet.    CONTROL PAIN Control pain so you can tolerate bowel movements,  walk, sleep, tolerate sneezing/coughing, and go up/down stairs.   HAVE A BOWEL MOVEMENT DAILY Keep your bowels regular to avoid problems.   Taking a fiber supplement every day to keep bowels soft.   Try a laxative to override constipation. Use an antidairrheal to slow down diarrhea.   Call if not better after 2 tries  WALK Walk an hour a day.  Control your pain to do that.   CALL IF YOU HAVE PROBLEMS/CONCERNS Call if you are still struggling despite following these instructions. Call if you have concerns not answered by these instructions  ######################################################################    1. Take your usually prescribed home medications unless otherwise directed.  2. DIET: Follow a light bland diet & liquids the first 24 hours after arrival home, such as soup, liquids, starches, etc.  Be sure to drink plenty of fluids.  Quickly advance to a usual solid diet within a few days.  Avoid fast food or heavy meals as your are more likely to get nauseated or have irregular bowels.  A low-fat, high-fiber diet for the rest of your life is ideal.  3. PAIN CONTROL: a. Pain is best controlled by a usual combination of three different methods TOGETHER: i. Ice/Heat ii. Over the counter pain medication iii. Prescription pain medication b. Expect swelling and discomfort in the anus/rectal area.  Warm water baths (30-60 minutes up to 6 times a day, especially after bowel meovements) will help. Use ice for the first few days to help decrease swelling and bruising, then switch to heat such as warm towels, sitz baths, warm baths, etc to help relax tight/sore spots and speed recovery.   Some people prefer to use ice alone, heat alone, alternating between ice & heat.  Experiment to what works for you.   c. It is helpful to take an over-the-counter pain medication continuously for the first few weeks.  Choose one of the following that works best for you: i. Naproxen (Aleve, etc)  Two 235m tabs twice a day ii. Ibuprofen (Advil, etc) Three 2031mtabs four times a day (every meal & bedtime) iii. Acetaminophen (Tylenol, etc) 500-65083mour times a day (every meal & bedtime) d. A  prescription for pain medication (such as oxycodone, hydrocodone, etc) should be given to you upon discharge.  Take your pain medication as prescribed.  i. If you are having problems/concerns with the prescription medicine (does not control pain, nausea, vomiting, rash, itching, etc), please call us [phone] see if we need to switch you to a different pain medicine that will work better for you and/or control your side effect better. ii. If you need a refill on your pain medication, please contact your pharmacy.  They will contact our office to request authorization. Prescriptions will not be filled after 5 pm or on week-ends.  If can take up to 48 hours for it to be filled & ready so avoid waiting until you are down to thel ast pill. e. A topical cream (Dibucaine) or a prescription for a cream (such as diltiazem 2% gel) may be given to you.  Many people find relief with topical creams.  Some people find it burns too much.  Experiment.  If it helps, use it.  If it burns, don't using  it.  Use a Sitz Bath 4-8 times a day for relief   [employer] A sitz bath is a warm water bath taken in the sitting position that covers only the hips and buttocks. It may be used for either healing or hygiene purposes. Sitz baths are also used to relieve pain, itching, or muscle spasms. The water may contain medicine. Moist heat will help you heal and relax.  HOME CARE INSTRUCTIONS  Take 3 to 4 sitz baths a day. 1. Fill the  bathtub half full with warm water. 2. Sit in the water and open the drain a little. 3. Turn on the warm water to keep the tub half full. Keep the water running constantly. 4. Soak in the water for 15 to 20 minutes. 5. After the sitz bath, pat the affected area dry first.   4. KEEP YOUR BOWELS REGULAR a. The goal is one soft bowel movement a day b. Avoid getting constipated.  Between the surgery and the pain medications, it is common to experience some constipation.  Increasing fluid intake and taking a fiber supplement (such as Metamucil, Citrucel, FiberCon, MiraLax, etc) 2-3 times a day regularly will usually help prevent this problem from occurring.  A mild laxative (prune juice, Milk of Magnesia, MiraLax, etc) should be taken according to package directions if there are no bowel movements after 48 hours. c. Watch out for diarrhea.  If you have many loose bowel movements, simplify your diet to bland foods & liquids for a few days.  Stop any stool softeners and decrease your fiber supplement.  Switching to mild anti-diarrheal medications (Kayopectate, Pepto Bismol) can help.  Can try an imodium/loperamide dose.  If this worsens or does not improve, please call us.  5. Wound Care  a. Remove your bandages with your first bowel movement, usually the day after surgery.  You may have packing if you had an abscess.  Let any packing or gauze fall come out.   b. Wear an absorbent pad or soft cotton balls in your underwear as needed to catch any drainage and help keep the area  c. Keep the area clean and dry.  Bathe / shower every day.  Keep the area clean by showering / bathing over the incision / wound.   It is okay to soak an open wound to help wash it.  Consider using a squeeze bottle filled with warm water to gently wash the anal area.  Wet wipes or showers / gentle washing after bowel movements is often less traumatic than regular toilet paper. d. Dennis Bast will often notice bleeding with bowel movements.   This should slow down by the end of the first week of surgery.  Sitting on an ice pack can help. e. Expect some drainage.  This should slow down by the end of the first week of surgery, but you will have occasional bleeding or drainage up to a few months after surgery.  Wear an absorbent pad or soft cotton gauze in your underwear until the drainage stops.  6. ACTIVITIES as tolerated:   a. You may resume regular (light) daily activities beginning the next day--such as daily self-care, walking, climbing stairs--gradually increasing activities as tolerated.  If you can walk 30 minutes without difficulty, it is safe to try more intense activity such as jogging, treadmill, bicycling, low-impact aerobics, swimming, etc. b. Save the most intensive and strenuous activity for last such as sit-ups, heavy lifting, contact sports, etc  Refrain from any heavy lifting or straining  until you are off narcotics for pain control.   c. DO NOT PUSH THROUGH PAIN.  Let pain be your guide: If it hurts to do something, don't do it.  Pain is your body warning you to avoid that activity for another week until the pain goes down. d. You may drive when you are no longer taking prescription pain medication, you can comfortably sit for long periods of time, and you can safely maneuver your car and apply brakes. e. Dennis Bast may have sexual intercourse when it is comfortable.  7. FOLLOW UP in our office a. Please call [facility] at [phone] to set up an appointment to see your surgeon in the office for a follow-up appointment approximately 2-3 weeks after your surgery. b. Make sure that you call for this appointment the day you arrive home to ensure a convenient appointment time.  8. IF YOU HAVE DISABILITY OR FAMILY LEAVE FORMS, BRING THEM TO THE OFFICE FOR PROCESSING.  DO NOT GIVE THEM TO YOUR DOCTOR.        WHEN TO CALL us [phone]: 1. Poor pain control 2. Reactions / problems with new medications (rash/itching, nausea,  etc)  3. Fever over 101.5 F (38.5 C) 4. Inability to urinate 5. Nausea and/or vomiting 6. Worsening swelling or bruising 7. Continued bleeding from incision. 8. Increased pain, redness, or drainage from the incision  The clinic staff is available to answer your questions during regular business hours (8:30am-5pm).  Please dont hesitate to call and ask to speak to one of our nurses for clinical concerns.   A surgeon from [facility] is always on call at the hospitals   If you have a medical emergency, go to the nearest emergency room or call 911.    [facility], [place], [place], [place], [place]  [address] ? MAIN: [phone] ? TOLL FREE: [phone] ? FAX [phone] [email or web address]     HEMORRHOIDS   Hemorrhoidal piles are natural clusters of blood vessels that help the rectum and anal canal stretch to hold stool and allow bowel movements.  Most people will develop a flare of hemorrhoids in their lifetime.  When hemorrhoidals are irritated, they can swell, burn, itch, cause pain, and bleed.  Most flares will calm down gradually within a few weeks.  However, once hemorrhoids are created, they tend to flare more easily.  Fortunately, good habits and simple medical treatment usually control hemorrhoids well, and surgery is needed only in severe cases.  TREATMENT OF HEMORRHOID FLARE Warm soaks. 4-8 times a day This helps more than any topical medication.   1. A sitz bath is a warm water bath taken in the sitting position that covers only the hips and buttocks.Fill the bathtub half full with warm water. 2. Soak in the water for 15 to 30 minutes. 3. After the sitz bath, pat the affected area dry first.  Normalize your bowels.  Extremes of diarrhea or constipation will make hemorrhoids worse.  One soft bowel movement a day is the goal.   Wet wipes instead of toilet paper Pain control with a NSAID such as ibuprofen (Advil)  or naproxen (Aleve) or acetaminophen (Tylenol) around the clock.  Narcotics are constipating and should be minimized if possible Topical creams contain steroids (bydrocortisone) or local anesthetic (xylocaine) can help make pain and itching more tolerable.    TROUBLESHOOTING IRREGULAR BOWELS 1) Avoid extremes of bowel movements (no bad constipation/diarrhea) 2) Miralax 17gm in 8oz. water or juice  every day. May use twice a day.  3) Gas-x or Phazyme as needed for gas & bloating.  4) Soft & bland diet. No spicy, greasy, or fried foods.  5) Omeprazole over-the-counter as needed  6) May hold gluten/wheat products from diet to see if symptoms improve.  7)  May try probiotics (Align, Activa, etc) to help calm the bowels down 7) If symptoms become worse: Call back immediately.   General Anesthesia, Adult, Care After This sheet gives you information about how to care for yourself after your procedure. Your health care provider may also give you more specific instructions. If you have problems or questions, contact your health care provider. What can I expect after the procedure? After the procedure, the following side effects are common:  Pain or discomfort at the IV site.  Nausea.  Vomiting.  Sore throat.  Trouble concentrating.  Feeling cold or chills.  Weak or tired.  Sleepiness and fatigue.  Soreness and body aches. These side effects can affect parts of the body that were not involved in surgery. Follow these instructions at home:  For at least 24 hours after the procedure:  Have a responsible adult stay with you. It is important to have someone help care for you until you are awake and alert.  Rest as needed.  Do not: ? Participate in activities in which you could fall or become injured. ? Drive. ? Use heavy machinery. ? Drink alcohol. ? Take sleeping pills or medicines that cause drowsiness. ? Make important decisions or sign legal documents. ? Take care of children  on your own. Eating and drinking  Follow any instructions from your health care provider about eating or drinking restrictions.  When you feel hungry, start by eating small amounts of foods that are soft and easy to digest (bland), such as toast. Gradually return to your regular diet.  Drink enough fluid to keep your urine pale yellow.  If you vomit, rehydrate by drinking water, juice, or clear broth. General instructions  If you have sleep apnea, surgery and certain medicines can increase your risk for breathing problems. Follow instructions from your health care provider about wearing your sleep device: ? Anytime you are sleeping, including during daytime naps. ? While taking prescription pain medicines, sleeping medicines, or medicines that make you drowsy.  Return to your normal activities as told by your health care provider. Ask your health care provider what activities are safe for you.  Take over-the-counter and prescription medicines only as told by your health care provider.  If you smoke, do not smoke without supervision.  Keep all follow-up visits as told by your health care provider. This is important. Contact a health care provider if:  You have nausea or vomiting that does not get better with medicine.  You cannot eat or drink without vomiting.  You have pain that does not get better with medicine.  You are unable to pass urine.  You develop a skin rash.  You have a fever.  You have redness around your IV site that gets worse. Get help right away if:  You have difficulty breathing.  You have chest pain.  You have blood in your urine or stool, or you vomit blood. Summary  After the procedure, it is common to have a sore throat or nausea. It is also common to feel tired.  Have a responsible adult stay with you for the first 24 hours after general anesthesia. It is important to have someone help care for  you until you are awake and alert.  When you feel  hungry, start by eating small amounts of foods that are soft and easy to digest (bland), such as toast. Gradually return to your regular diet.  Drink enough fluid to keep your urine pale yellow.  Return to your normal activities as told by your health care provider. Ask your health care provider what activities are safe for you. This information is not intended to replace advice given to you by your health care provider. Make sure you discuss any questions you have with your health care provider. Document Released: 01/26/2001 Document Revised: 10/23/2017 Document Reviewed: 06/05/2017 Elsevier Patient Education  2020 [employer].

## 2019-07-07 NOTE — Anesthesia Preprocedure Evaluation (Addendum)
Anesthesia Evaluation    Reviewed: Allergy & Precautions, Patient's Chart, lab work & pertinent test results  History of Anesthesia Complications (+) PONVNegative for: history of anesthetic complications  Airway Mallampati: II  TM Distance: >3 FB Neck ROM: Full    Dental no notable dental hx. (+) Dental Advisory Given   Pulmonary neg pulmonary ROS,  07/04/2019 SARS coronavirus NEG   Pulmonary exam normal breath sounds clear to auscultation       Cardiovascular negative cardio ROS Normal cardiovascular exam Rhythm:Regular Rate:Normal     Neuro/Psych PSYCHIATRIC DISORDERS Anxiety negative neurological ROS     GI/Hepatic negative GI ROS, Neg liver ROS,   Endo/Other  negative endocrine ROS  Renal/GU negative Renal ROS  negative genitourinary   Musculoskeletal negative musculoskeletal ROS (+)   Abdominal   Peds  Hematology negative hematology ROS (+)   Anesthesia Other Findings HLD  Reproductive/Obstetrics                           Anesthesia Physical Anesthesia Plan  ASA: II  Anesthesia Plan: General   Post-op Pain Management:    Induction: Intravenous  PONV Risk Score and Plan: 3 and Ondansetron, Dexamethasone, Midazolam and Treatment may vary due to age or medical condition  Airway Management Planned: Oral ETT  Additional Equipment: None  Intra-op Plan:   Post-operative Plan: Extubation in OR  Informed Consent:   Plan Discussed with:   Anesthesia Plan Comments:        Anesthesia Quick Evaluation

## 2019-07-07 NOTE — Anesthesia Procedure Notes (Signed)
Procedure Name: Intubation Date/Time: 07/07/2019 12:25 PM Performed by: [place] Deanglo Hissong C, CRNA Pre-anesthesia Checklist: Patient identified, Emergency Drugs available, Suction available and Patient being monitored Patient Re-evaluated:Patient Re-evaluated prior to induction Oxygen Delivery Method: Circle system utilized Preoxygenation: Pre-oxygenation with 100% oxygen Induction Type: IV induction Ventilation: Mask ventilation without difficulty Laryngoscope Size: Mac and 4 Grade View: Grade I Tube type: Oral Tube size: 7.5 mm Number of attempts: 1 Airway Equipment and Method: Stylet and Oral airway Placement Confirmation: ETT inserted through vocal cords under direct vision,  positive ETCO2 and breath sounds checked- equal and bilateral Secured at: 22 cm Tube secured with: Tape Dental Injury: Teeth and Oropharynx as per pre-operative assessment

## 2019-07-07 NOTE — Interval H&P Note (Signed)
History and Physical Interval Note:  07/07/2019 11:41 AM  Timothy Hoffman  has presented today for surgery, with the diagnosis of HEMORRHOIDS PROLAPSING GRADE 3 WITH BLEEDING AND PAIN.  The various methods of treatment have been discussed with the patient and family. After consideration of risks, benefits and other options for treatment, the patient has consented to  Procedure(s) with comments: HEMORRHOIDECTOMY, [place] (N/A) - LOCAL as a surgical intervention.  The patient's history has been reviewed, patient examined, no change in status, stable for surgery.  I have reviewed the patient's chart and labs.  Questions were answered to the patient's satisfaction.    I have re-reviewed the the patient's records, history, medications, and allergies.  I have re-examined the patient.  I again discussed intraoperative plans and goals of post-operative recovery.  The patient agrees to proceed.  Timothy Hoffman  26-Jul-1961 [id number]  Patient Care Team: Mosie Lukes, MD as PCP - General (Family Medicine) Alyson Ingles Candee Furbish, MD as Consulting Physician (Urology) Michael Boston, MD as Consulting Physician (General Surgery) Irene Shipper, MD as Consulting Physician (Gastroenterology)  Patient Active Problem List   Diagnosis Date Noted  . IDA (iron deficiency anemia) 04/04/2019  . History of colon polyps 09/06/2018  . Preventative health care 09/06/2018  . Dermatitis 09/06/2018  . Anemia   . PSA elevation 12/15/2016  . Lipid disorder 07/13/2014  . Elevated blood-pressure reading without diagnosis of hypertension 07/13/2014  . Generalized anxiety disorder 06/02/2013    Past Medical History:  Diagnosis Date  . Allergy    SEASONAL  . Anemia    IV iron x3 05/2019  . Anxiety   . Arrhythmia 1999  . Atopic dermatitis   . Diverticulosis   . History of colon polyps   . Hyperlipemia   . Internal hemorrhoid, bleeding 2013  . PONV (postoperative  nausea and vomiting)     Past Surgical History:  Procedure Laterality Date  . COLONOSCOPY    . KNEE ARTHROSCOPY Right 2012   Meniscal repair  . Right shoulder scope    . WISDOM TOOTH EXTRACTION      Social History   Socioeconomic History  . Marital status: Married    Spouse name: Erline Levine  . Number of children: 2  . Years of education: college  . Highest education level: Not on file  Occupational History  . Occupation: Automotive engineer: [employer]  Social Needs  . Financial resource strain: Not hard at all  . Food insecurity    Worry: Never true    Inability: Never true  . Transportation needs    Medical: No    Non-medical: No  Tobacco Use  . Smoking status: Never Smoker  . Smokeless tobacco: Never Used  . Tobacco comment: 20 yrs ago   Substance and Sexual Activity  . Alcohol use: Yes    Alcohol/week: 2.0 standard drinks    Types: 2 Cans of beer per week  . Drug use: No  . Sexual activity: Yes    Partners: Female  Lifestyle  . Physical activity    Days per week: 6 days    Minutes per session: 50 min  . Stress: Not on file  Relationships  . Social connections    Talks on phone: More than three times a week    Gets together: Three times a week    Attends religious service: Never    Active member of club or organization: No  Attends meetings of clubs or organizations: Never    Relationship status: Married  . Intimate partner violence    Fear of current or ex partner: No    Emotionally abused: No    Physically abused: No    Forced sexual activity: No  Other Topics Concern  . Not on file  Social History Narrative   Lives with his wife and their 2 children.     His wife is an Therapist, sports.     Their son has significant anxiety.   He exercises by running 3-5 miles 4x/week.      Vegan    Family History  Problem Relation Age of Onset  . Lymphoma Father   . Cancer Father        nonHodgkin's lymphoma  . Hyperlipidemia Mother   . Hypertension  Mother   . Hypertension Brother   . Hyperlipidemia Brother   . Mental illness Son        Anxiety  . Anxiety disorder Son   . Stroke Maternal Grandmother   . Colon cancer Neg Hx     Medications Prior to Admission  Medication Sig Dispense Refill Last Dose  . ezetimibe (ZETIA) 10 MG tablet Take 1 tablet (10 mg total) by mouth daily. (Patient taking differently: Take 10 mg by mouth at bedtime. ) 90 tablet 3 07/06/2019 at Unknown time  . Ferrous Fumarate (IRON) 18 MG TBCR Take 18 mg by mouth daily.   [id number]  . folic acid (FOLVITE) 1 MG tablet Take 1 tablet (1 mg total) by mouth daily. 30 tablet 6 07/07/2019 at 0745  . hydrocortisone cream 1 % Apply 1 application topically daily as needed (poison ivy).   Past Month at Unknown time  . ibuprofen (ADVIL) 200 MG tablet Take 600 mg by mouth every 6 (six) hours as needed for headache or moderate pain.   Past Month at Unknown time  . loratadine (CLARITIN) 10 MG tablet Take 10 mg by mouth daily as needed for allergies.   Past Month at Unknown time  . Omega-3 Fatty Acids (FISH OIL PO) Take 1,280 mg by mouth 2 (two) times a week.   07/04/2019  . rosuvastatin (CRESTOR) 40 MG tablet Take 1 tablet (40 mg total) by mouth daily. (Patient taking differently: Take 40 mg by mouth at bedtime. ) 90 tablet 3 07/06/2019 at Unknown time  . sertraline (ZOLOFT) 25 MG tablet TAKE 1 TABLET BY MOUTH DAILY. (Patient taking differently: Take 25 mg by mouth daily. ) 90 tablet 1 07/07/2019 at 0745  . triamcinolone cream (KENALOG) 0.1 % Apply topically daily. (Patient taking differently: Apply 1 application topically daily as needed (atopic dermatitis). ) 80 g 1 07/06/2019 at Unknown time    Current Facility-Administered Medications  Medication Dose Route Frequency Provider Last Rate Last Dose  . bupivacaine liposome (EXPAREL) 1.3 % injection 266 mg  20 mL Infiltration On Call to OR Michael Boston, MD      . ceFAZolin (ANCEF) IVPB 2g/100 mL premix  2 g Intravenous On Call to OR Michael Boston, MD       And  . metroNIDAZOLE (FLAGYL) IVPB 500 mg  500 mg Intravenous On Call to OR Michael Boston, MD      . Chlorhexidine Gluconate Cloth 2 % PADS 6 each  6 each Topical Once Michael Boston, MD       And  . Chlorhexidine Gluconate Cloth 2 % PADS 6 each  6 each Topical Once Michael Boston, MD      .  lactated ringers infusion   Intravenous Continuous Lidia Collum, MD 50 mL/hr at 07/07/19 1049    . scopolamine (TRANSDERM-SCOP) 1 MG/3DAYS 1.5 mg  1 patch Transdermal Once Annye Asa, MD   1.5 mg at 07/07/19 1051     Allergies  Allergen Reactions  . Rose Hips [Ascorbate] Itching and Rash  . Tequin Rash    BP (!) 154/106   Pulse 92   Temp 98.8 F (37.1 C) (Oral)   Resp 18   Ht 5\' 10"  (1.778 m)   Wt 85.4 kg   SpO2 98%   BMI 27.00 kg/m   Labs: Results for orders placed or performed during the hospital encounter of 07/06/19 (from the past 48 hour(s))  CBC     Status: Abnormal   Collection Time: 07/06/19  2:55 PM  Result Value Ref Range   WBC 5.4 4.0 - 10.5 K/uL   RBC 4.20 (L) 4.22 - 5.81 MIL/uL   Hemoglobin 11.7 (L) 13.0 - 17.0 g/dL   HCT 37.2 (L) 39.0 - 52.0 %   MCV 88.6 80.0 - 100.0 fL   MCH 27.9 26.0 - 34.0 pg   MCHC 31.5 30.0 - 36.0 g/dL   RDW 17.6 (H) 11.5 - 15.5 %   Platelets 311 150 - 400 K/uL   nRBC 0.0 0.0 - 0.2 %    Comment: Performed at [facility], [place] [address] [place], [place] [address]    Imaging / Studies: No results found.   Adin Hector, M.D., F.A.C.S. Gastrointestinal and Minimally Invasive Surgery [facility] [address] [place], [place] [address] [phone] Main / Paging  07/07/2019 11:41 AM    Adin Hector

## 2019-07-07 NOTE — Transfer of Care (Signed)
Immediate Anesthesia Transfer of Care Note  Patient: Timothy Hoffman  Procedure(s) Performed: HEMORRHOIDECTOMY, HEMORRHOIDAL LIGATION/PEXY ANORECTAL EXAM UNDER ANESTHESIA (N/A Rectum)  Patient Location: PACU  Anesthesia Type:General  Level of Consciousness: awake, alert  and oriented  Airway & Oxygen Therapy: Patient Spontanous Breathing and Patient connected to face mask oxygen  Post-op Assessment: Report given to RN and Post -op Vital signs reviewed and stable  Post vital signs: Reviewed and stable  Last Vitals:  Vitals Value Taken Time  BP 155/91 07/07/19 1400  Temp    Pulse 70 07/07/19 1401  Resp 16 07/07/19 1401  SpO2 100 % 07/07/19 1401  Vitals shown include unvalidated device data.  Last Pain:  Vitals:   07/07/19 1029  TempSrc: Oral         Complications: No apparent anesthesia complications

## 2019-07-08 ENCOUNTER — Encounter (HOSPITAL_COMMUNITY): Payer: Self-pay | Admitting: Surgery

## 2019-07-22 MED FILL — FOLIC ACID 1 MG TABS: 1 | 30 days supply | Qty: 30 | Fill #3

## 2019-08-11 ENCOUNTER — Encounter: Payer: Self-pay | Admitting: Family

## 2019-08-11 ENCOUNTER — Inpatient Hospital Stay (HOSPITAL_BASED_OUTPATIENT_CLINIC_OR_DEPARTMENT_OTHER): Payer: No Typology Code available for payment source | Admitting: Family

## 2019-08-11 ENCOUNTER — Telehealth: Payer: Self-pay | Admitting: Family

## 2019-08-11 ENCOUNTER — Inpatient Hospital Stay: Payer: No Typology Code available for payment source | Attending: Family

## 2019-08-11 ENCOUNTER — Other Ambulatory Visit: Payer: Self-pay

## 2019-08-11 VITALS — BP 140/96 | HR 95 | Temp 97.7°F | Resp 18 | Ht 70.0 in | Wt 187.8 lb

## 2019-08-11 DIAGNOSIS — D508 Other iron deficiency anemias: Secondary | ICD-10-CM | POA: Diagnosis not present

## 2019-08-11 DIAGNOSIS — D500 Iron deficiency anemia secondary to blood loss (chronic): Secondary | ICD-10-CM | POA: Diagnosis not present

## 2019-08-11 DIAGNOSIS — Z79899 Other long term (current) drug therapy: Secondary | ICD-10-CM | POA: Insufficient documentation

## 2019-08-11 DIAGNOSIS — K649 Unspecified hemorrhoids: Secondary | ICD-10-CM | POA: Insufficient documentation

## 2019-08-11 LAB — CBC WITH DIFFERENTIAL (CANCER CENTER ONLY)
Abs Immature Granulocytes: 0.06 10*3/uL (ref 0.00–0.07)
Basophils Absolute: 0 10*3/uL (ref 0.0–0.1)
Basophils Relative: 1 %
Eosinophils Absolute: 0.1 10*3/uL (ref 0.0–0.5)
Eosinophils Relative: 1 %
HCT: 35.9 % — ABNORMAL LOW (ref 39.0–52.0)
Hemoglobin: 11.2 g/dL — ABNORMAL LOW (ref 13.0–17.0)
Immature Granulocytes: 1 %
Lymphocytes Relative: 18 %
Lymphs Abs: 1 10*3/uL (ref 0.7–4.0)
MCH: 26.3 pg (ref 26.0–34.0)
MCHC: 31.2 g/dL (ref 30.0–36.0)
MCV: 84.3 fL (ref 80.0–100.0)
Monocytes Absolute: 0.7 10*3/uL (ref 0.1–1.0)
Monocytes Relative: 13 %
Neutro Abs: 3.7 10*3/uL (ref 1.7–7.7)
Neutrophils Relative %: 66 %
Platelet Count: 286 10*3/uL (ref 150–400)
RBC: 4.26 MIL/uL (ref 4.22–5.81)
RDW: 16.3 % — ABNORMAL HIGH (ref 11.5–15.5)
WBC Count: 5.5 10*3/uL (ref 4.0–10.5)
nRBC: 0 % (ref 0.0–0.2)

## 2019-08-11 LAB — RETICULOCYTES
Immature Retic Fract: 25 % — ABNORMAL HIGH (ref 2.3–15.9)
RBC.: 4.24 MIL/uL (ref 4.22–5.81)
Retic Count, Absolute: 72.5 10*3/uL (ref 19.0–186.0)
Retic Ct Pct: 1.7 % (ref 0.4–3.1)

## 2019-08-11 NOTE — Telephone Encounter (Signed)
Appointments scheduled and patient will get updates on My Chart per 10/8 los

## 2019-08-11 NOTE — Progress Notes (Signed)
Hematology and Oncology Follow Up Visit  Timothy Hoffman [id number] [birth year] 58 y.o. 08/11/2019   Principle Diagnosis:  Iron deficiency anemia  Current Therapy:   IV iron as indicated    Interim History:  Timothy Hoffman is here today for follow-up. He is doing quite well and has no complaints at this time.  He had his hemorrhoidectomy surgery on 07/07/2019. Since then he states that he has had little to no blood loss. Hgb is stable at 11.2, MCV 84, platelet count 286.  He is using stool softeners to prevent any straining.  No bruising or petechiae.  No fever, chills, n/v, cough, rash, dizziness, SOB, chest pain, palpitations, abdominal pain or changes in bladder habits.  No swelling, tenderness, numbness or tingling in his extremities.  He is eating well and staying hydrated. His weight is stable.   ECOG Performance Status: 1 - Symptomatic but completely ambulatory  Medications:  Allergies as of 08/11/2019      Reactions   Rose Hips [ascorbate] Itching, Rash   Tequin Rash      Medication List       Accurate as of August 11, 2019  2:26 PM. If you have any questions, ask your nurse or doctor.        STOP taking these medications   oxyCODONE 5 MG immediate release tablet Commonly known as: Oxy IR/ROXICODONE Stopped by: Laverna Peace, NP     TAKE these medications   ezetimibe 10 MG tablet Commonly known as: ZETIA Take 1 tablet (10 mg total) by mouth daily. What changed: when to take this   FISH OIL PO Take 1,280 mg by mouth 2 (two) times a week.   folic acid 1 MG tablet Commonly known as: FOLVITE Take 1 tablet (1 mg total) by mouth daily.   hydrocortisone cream 1 % Apply 1 application topically daily as needed (poison ivy).   ibuprofen 200 MG tablet Commonly known as: ADVIL Take 600 mg by mouth every 6 (six) hours as needed for headache or moderate pain.   Iron 18 MG Tbcr Take 18 mg by mouth daily.   loratadine 10 MG tablet Commonly known as: CLARITIN Take  10 mg by mouth daily as needed for allergies.   rosuvastatin 40 MG tablet Commonly known as: CRESTOR Take 1 tablet (40 mg total) by mouth daily. What changed: when to take this   sertraline 25 MG tablet Commonly known as: ZOLOFT TAKE 1 TABLET BY MOUTH DAILY.   triamcinolone cream 0.1 % Commonly known as: KENALOG Apply topically daily. What changed:   how much to take  when to take this  reasons to take this       Allergies:  Allergies  Allergen Reactions  . Rose Hips [Ascorbate] Itching and Rash  . Tequin Rash    Past Medical History, Surgical history, Social history, and Family History were reviewed and updated.  Review of Systems: All other 10 point review of systems is negative.   Physical Exam:  height is 5\' 10"  (1.778 m) and weight is 187 lb 12.8 oz (85.2 kg). His temporal temperature is 97.7 F (36.5 C). His blood pressure is 140/96 (abnormal) and his pulse is 95. His respiration is 18 and oxygen saturation is 99%.   Wt Readings from Last 3 Encounters:  08/11/19 187 lb 12.8 oz (85.2 kg)  07/07/19 188 lb 3 oz (85.4 kg)  07/06/19 188 lb 3 oz (85.4 kg)    Ocular: Sclerae unicteric, pupils equal, round and reactive to light  Ear-nose-throat: Oropharynx clear, dentition fair Lymphatic: No cervical or supraclavicular adenopathy Lungs no rales or rhonchi, good excursion bilaterally Heart regular rate and rhythm, no murmur appreciated Abd soft, nontender, positive bowel sounds, no liver or spleen tip palpated on exam, no fluid wave  MSK no focal spinal tenderness, no joint edema Neuro: non-focal, well-oriented, appropriate affect Breasts: Deferred   Lab Results  Component Value Date   WBC 5.5 08/11/2019   HGB 11.2 (L) 08/11/2019   HCT 35.9 (L) 08/11/2019   MCV 84.3 08/11/2019   PLT 286 08/11/2019   Lab Results  Component Value Date   FERRITIN 25 05/11/2019   IRON 57 05/11/2019   TIBC 436 (H) 05/11/2019   UIBC 379 (H) 05/11/2019   IRONPCTSAT 13 (L)  05/11/2019   Lab Results  Component Value Date   RETICCTPCT 1.7 08/11/2019   RBC 4.26 08/11/2019   RBC 4.24 08/11/2019   RETICCTABS 64,680 03/15/2019   No results found for: KPAFRELGTCHN, LAMBDASER, KAPLAMBRATIO No results found for: IGGSERUM, IGA, IGMSERUM No results found for: Odetta Pink, SPEI   Chemistry      Component Value Date/Time   NA 141 04/04/2019 1327   NA 140 07/24/2017 1554   K 4.6 04/04/2019 1327   CL 105 04/04/2019 1327   CO2 27 04/04/2019 1327   BUN 20 04/04/2019 1327   BUN 26 (H) 07/24/2017 1554   CREATININE 0.86 04/04/2019 1327   CREATININE 0.94 06/24/2016 1223      Component Value Date/Time   CALCIUM 9.0 04/04/2019 1327   ALKPHOS 57 04/04/2019 1327   AST 27 04/04/2019 1327   ALT 22 04/04/2019 1327   BILITOT 0.4 04/04/2019 1327       Impression and Plan: Timothy Hoffman is a very pleasant 58 yo caucasian gentleman with iron deficiency anemia secondary to blood loss with hemorrhoids.  He had hemorrhoidectomy in September. Hopefully this will resolve the IDA.  We will see what his iron studies show and bring him back in for infusion if needed.  We will go ahead and plan to see him back in another 4 months.  He will contact our office with any questions or concerns. We can certainly see him sooner if needed.   Laverna Peace, NP 10/8/20202:26 PM

## 2019-08-12 ENCOUNTER — Telehealth: Payer: Self-pay | Admitting: Family

## 2019-08-12 LAB — FERRITIN: Ferritin: 5 ng/mL — ABNORMAL LOW (ref 24–336)

## 2019-08-12 LAB — IRON AND TIBC
Iron: 45 ug/dL (ref 42–163)
Saturation Ratios: 10 % — ABNORMAL LOW (ref 20–55)
TIBC: 440 ug/dL — ABNORMAL HIGH (ref 202–409)
UIBC: 395 ug/dL — ABNORMAL HIGH (ref 117–376)

## 2019-08-12 NOTE — Telephone Encounter (Signed)
lmom to inform patient of iron appts10/16 and 10/23 per 10/9 sch msg

## 2019-08-13 MED FILL — FLUARIX QUADRIVALENT 0.5 ML: 0.5 | 1 days supply | Qty: 1 | Fill #0

## 2019-08-19 ENCOUNTER — Inpatient Hospital Stay: Payer: No Typology Code available for payment source

## 2019-08-19 ENCOUNTER — Other Ambulatory Visit: Payer: Self-pay

## 2019-08-19 VITALS — BP 157/88 | HR 87 | Temp 97.7°F | Resp 20

## 2019-08-19 DIAGNOSIS — D508 Other iron deficiency anemias: Secondary | ICD-10-CM | POA: Diagnosis not present

## 2019-08-19 MED ORDER — SODIUM CHLORIDE 0.9 % IV SOLN
INTRAVENOUS | Status: DC
  Administered 2019-08-19: 14:00:00 via INTRAVENOUS
  Filled 2019-08-19: qty 250

## 2019-08-19 MED ORDER — SODIUM CHLORIDE 0.9 % IV SOLN
510.0000 mg | Freq: Once | INTRAVENOUS | Status: AC
  Administered 2019-08-19: 510 mg via INTRAVENOUS
  Filled 2019-08-19: qty 17

## 2019-08-19 NOTE — Patient Instructions (Signed)
Ferumoxytol injection (Feraheme) What is this medicine? FERUMOXYTOL is an iron complex. Iron is used to make healthy red blood cells, which carry oxygen and nutrients throughout the body. This medicine is used to treat iron deficiency anemia. This medicine may be used for other purposes; ask your health care provider or pharmacist if you have questions. COMMON BRAND NAME(S): Feraheme What should I tell my health care provider before I take this medicine? They need to know if you have any of these conditions:  anemia not caused by low iron levels  high levels of iron in the blood  magnetic resonance imaging (MRI) test scheduled  an unusual or allergic reaction to iron, other medicines, foods, dyes, or preservatives  pregnant or trying to get pregnant  breast-feeding How should I use this medicine? This medicine is for injection into a vein. It is given by a health care professional in a hospital or clinic setting. Talk to your pediatrician regarding the use of this medicine in children. Special care may be needed. Overdosage: If you think you have taken too much of this medicine contact a poison control center or emergency room at once. NOTE: This medicine is only for you. Do not share this medicine with others. What if I miss a dose? It is important not to miss your dose. Call your doctor or health care professional if you are unable to keep an appointment. What may interact with this medicine? This medicine may interact with the following medications:  other iron products This list may not describe all possible interactions. Give your health care provider a list of all the medicines, herbs, non-prescription drugs, or dietary supplements you use. Also tell them if you smoke, drink alcohol, or use illegal drugs. Some items may interact with your medicine. What should I watch for while using this medicine? Visit your doctor or healthcare professional regularly. Tell your doctor or  healthcare professional if your symptoms do not start to get better or if they get worse. You may need blood work done while you are taking this medicine. You may need to follow a special diet. Talk to your doctor. Foods that contain iron include: whole grains/cereals, dried fruits, beans, or peas, leafy green vegetables, and organ meats (liver, kidney). What side effects may I notice from receiving this medicine? Side effects that you should report to your doctor or health care professional as soon as possible:  allergic reactions like skin rash, itching or hives, swelling of the face, lips, or tongue  breathing problems  changes in blood pressure  feeling faint or lightheaded, falls  fever or chills  flushing, sweating, or hot feelings  swelling of the ankles or feet Side effects that usually do not require medical attention (report to your doctor or health care professional if they continue or are bothersome):  diarrhea  headache  nausea, vomiting  stomach pain This list may not describe all possible side effects. Call your doctor for medical advice about side effects. You may report side effects to FDA at 1-800-FDA-1088. Where should I keep my medicine? This drug is given in a hospital or clinic and will not be stored at home. NOTE: This sheet is a summary. It may not cover all possible information. If you have questions about this medicine, talk to your doctor, pharmacist, or health care provider.  2020 Elsevier/Gold Standard (2016-12-08 20:21:10)  

## 2019-08-24 MED FILL — FOLIC ACID 1 MG TABS: 1 | 30 days supply | Qty: 30 | Fill #4

## 2019-08-26 ENCOUNTER — Inpatient Hospital Stay: Payer: No Typology Code available for payment source

## 2019-08-26 ENCOUNTER — Other Ambulatory Visit: Payer: Self-pay

## 2019-08-26 VITALS — BP 155/101 | HR 91 | Temp 97.5°F | Resp 18

## 2019-08-26 DIAGNOSIS — D508 Other iron deficiency anemias: Secondary | ICD-10-CM

## 2019-08-26 MED ORDER — SODIUM CHLORIDE 0.9 % IV SOLN
510.0000 mg | Freq: Once | INTRAVENOUS | Status: AC
  Administered 2019-08-26: 510 mg via INTRAVENOUS
  Filled 2019-08-26: qty 510

## 2019-08-26 MED ORDER — SODIUM CHLORIDE 0.9 % IV SOLN
INTRAVENOUS | Status: DC
  Administered 2019-08-26: 14:00:00 via INTRAVENOUS
  Filled 2019-08-26: qty 250

## 2019-09-09 ENCOUNTER — Other Ambulatory Visit: Payer: Self-pay | Admitting: Family Medicine

## 2019-09-09 DIAGNOSIS — E789 Disorder of lipoprotein metabolism, unspecified: Secondary | ICD-10-CM

## 2019-09-09 MED FILL — EZETIMIBE 10 MG TABS: 10 | 90 days supply | Qty: 90 | Fill #0

## 2019-09-09 MED FILL — ROSUVASTATIN CALCIUM 40 MG: 40 | 90 days supply | Qty: 90 | Fill #0

## 2019-09-14 ENCOUNTER — Other Ambulatory Visit: Payer: Self-pay | Admitting: Family Medicine

## 2019-09-14 DIAGNOSIS — F411 Generalized anxiety disorder: Secondary | ICD-10-CM

## 2019-09-14 MED FILL — SERTRALINE HCL 25 MG TABLET: 25 | 90 days supply | Qty: 90 | Fill #0

## 2019-10-03 MED FILL — FOLIC ACID 1 MG TABS: 1 | 30 days supply | Qty: 30 | Fill #5

## 2019-10-11 ENCOUNTER — Encounter: Payer: Self-pay | Admitting: Family Medicine

## 2019-11-09 MED FILL — FOLIC ACID 1 MG TABS: 1 | 30 days supply | Qty: 30 | Fill #6

## 2019-12-09 ENCOUNTER — Other Ambulatory Visit: Payer: Self-pay | Admitting: Family Medicine

## 2019-12-09 DIAGNOSIS — E789 Disorder of lipoprotein metabolism, unspecified: Secondary | ICD-10-CM

## 2019-12-09 MED FILL — ROSUVASTATIN CALCIUM 40 MG: 40 | 90 days supply | Qty: 90 | Fill #0

## 2019-12-12 ENCOUNTER — Other Ambulatory Visit: Payer: Self-pay

## 2019-12-12 ENCOUNTER — Encounter: Payer: Self-pay | Admitting: Family

## 2019-12-12 ENCOUNTER — Inpatient Hospital Stay: Payer: No Typology Code available for payment source | Attending: Family

## 2019-12-12 ENCOUNTER — Inpatient Hospital Stay (HOSPITAL_BASED_OUTPATIENT_CLINIC_OR_DEPARTMENT_OTHER): Payer: No Typology Code available for payment source | Admitting: Family

## 2019-12-12 VITALS — BP 170/99 | HR 85 | Temp 97.1°F | Resp 19 | Ht 70.0 in | Wt 189.1 lb

## 2019-12-12 DIAGNOSIS — K649 Unspecified hemorrhoids: Secondary | ICD-10-CM | POA: Insufficient documentation

## 2019-12-12 DIAGNOSIS — Z79899 Other long term (current) drug therapy: Secondary | ICD-10-CM | POA: Diagnosis not present

## 2019-12-12 DIAGNOSIS — D500 Iron deficiency anemia secondary to blood loss (chronic): Secondary | ICD-10-CM | POA: Diagnosis present

## 2019-12-12 LAB — CBC WITH DIFFERENTIAL (CANCER CENTER ONLY)
Abs Immature Granulocytes: 0.02 10*3/uL (ref 0.00–0.07)
Basophils Absolute: 0 10*3/uL (ref 0.0–0.1)
Basophils Relative: 1 %
Eosinophils Absolute: 0 10*3/uL (ref 0.0–0.5)
Eosinophils Relative: 0 %
HCT: 42.5 % (ref 39.0–52.0)
Hemoglobin: 14.2 g/dL (ref 13.0–17.0)
Immature Granulocytes: 0 %
Lymphocytes Relative: 20 %
Lymphs Abs: 1 10*3/uL (ref 0.7–4.0)
MCH: 30.1 pg (ref 26.0–34.0)
MCHC: 33.4 g/dL (ref 30.0–36.0)
MCV: 90.2 fL (ref 80.0–100.0)
Monocytes Absolute: 0.5 10*3/uL (ref 0.1–1.0)
Monocytes Relative: 10 %
Neutro Abs: 3.3 10*3/uL (ref 1.7–7.7)
Neutrophils Relative %: 69 %
Platelet Count: 256 10*3/uL (ref 150–400)
RBC: 4.71 MIL/uL (ref 4.22–5.81)
RDW: 14 % (ref 11.5–15.5)
WBC Count: 4.9 10*3/uL (ref 4.0–10.5)
nRBC: 0 % (ref 0.0–0.2)

## 2019-12-12 LAB — RETICULOCYTES
Immature Retic Fract: 9.5 % (ref 2.3–15.9)
RBC.: 4.68 MIL/uL (ref 4.22–5.81)
Retic Count, Absolute: 90.8 10*3/uL (ref 19.0–186.0)
Retic Ct Pct: 1.9 % (ref 0.4–3.1)

## 2019-12-12 NOTE — Progress Notes (Signed)
Hematology and Oncology Follow Up Visit  Timothy Hoffman [id number] 04/25/1961 59 y.o. 12/12/2019   Principle Diagnosis:  Iron deficiency anemia  Current Therapy:   IV iron as indicated   Interim History:  Mr. Timothy Hoffman is here today for follow-up. He is doing well and has no complaints at this time.  He denies fatigued.  No episodes of bleeding. No bruising or petechiae.  No fever, chills, n/v, cough, rash, dizziness, SOB, chest pain, palpitations, abdominal pain or changes in bowel or bladder habits.  No swelling, tenderness, numbness or tingling in his extremities.  No falls or syncopal episodes.  He is eating well and hydrating appropriately. His weight is stable.   ECOG Performance Status: 0 - Asymptomatic  Medications:  Allergies as of 12/12/2019      Reactions   Rose Hips [ascorbate] Itching, Rash   Tequin Rash      Medication List       Accurate as of December 12, 2019  2:35 PM. If you have any questions, ask your nurse or doctor.        ezetimibe 10 MG tablet Commonly known as: ZETIA TAKE 1 TABLET BY MOUTH DAILY.   FISH OIL PO Take 1,280 mg by mouth 2 (two) times a week.   folic acid 1 MG tablet Commonly known as: FOLVITE Take 1 tablet (1 mg total) by mouth daily.   hydrocortisone cream 1 % Apply 1 application topically daily as needed (poison ivy).   ibuprofen 200 MG tablet Commonly known as: ADVIL Take 600 mg by mouth every 6 (six) hours as needed for headache or moderate pain.   Iron 18 MG Tbcr Take 18 mg by mouth daily.   loratadine 10 MG tablet Commonly known as: CLARITIN Take 10 mg by mouth daily as needed for allergies.   rosuvastatin 40 MG tablet Commonly known as: CRESTOR Take 1 tablet (40 mg total) by mouth daily.   sertraline 25 MG tablet Commonly known as: ZOLOFT TAKE 1 TABLET BY MOUTH DAILY.   sildenafil 20 MG tablet Commonly known as: REVATIO Take 3 tablets by mouth as needed.   triamcinolone cream 0.1 % Commonly known as:  KENALOG Apply topically daily. What changed:   how much to take  when to take this  reasons to take this       Allergies:  Allergies  Allergen Reactions  . Rose Hips [Ascorbate] Itching and Rash  . Tequin Rash    Past Medical History, Surgical history, Social history, and Family History were reviewed and updated.  Review of Systems: All other 10 point review of systems is negative.   Physical Exam:  height is 5\' 10"  (1.778 m) and weight is 189 lb 1.9 oz (85.8 kg). His temporal temperature is 97.1 F (36.2 C) (abnormal). His blood pressure is 170/99 (abnormal) and his pulse is 85. His respiration is 19 and oxygen saturation is 100%.   Wt Readings from Last 3 Encounters:  12/12/19 189 lb 1.9 oz (85.8 kg)  08/11/19 187 lb 12.8 oz (85.2 kg)  07/07/19 188 lb 3 oz (85.4 kg)    Ocular: Sclerae unicteric, pupils equal, round and reactive to light Ear-nose-throat: Oropharynx clear, dentition fair Lymphatic: No cervical or supraclavicular adenopathy Lungs no rales or rhonchi, good excursion bilaterally Heart regular rate and rhythm, no murmur appreciated Abd soft, nontender, positive bowel sounds, no liver or spleen tip palpated on exam, no fluid wave  MSK no focal spinal tenderness, no joint edema Neuro: non-focal, well-oriented, appropriate affect  Breasts: Deferred   Lab Results  Component Value Date   WBC 4.9 12/12/2019   HGB 14.2 12/12/2019   HCT 42.5 12/12/2019   MCV 90.2 12/12/2019   PLT 256 12/12/2019   Lab Results  Component Value Date   FERRITIN 5 (L) 08/11/2019   IRON 45 08/11/2019   TIBC 440 (H) 08/11/2019   UIBC 395 (H) 08/11/2019   IRONPCTSAT 10 (L) 08/11/2019   Lab Results  Component Value Date   RETICCTPCT 1.9 12/12/2019   RBC 4.68 12/12/2019   RETICCTABS 64,680 03/15/2019   No results found for: KPAFRELGTCHN, LAMBDASER, KAPLAMBRATIO No results found for: IGGSERUM, IGA, IGMSERUM No results found for: Odetta Pink, SPEI   Chemistry      Component Value Date/Time   NA 141 04/04/2019 1327   NA 140 07/24/2017 1554   K 4.6 04/04/2019 1327   CL 105 04/04/2019 1327   CO2 27 04/04/2019 1327   BUN 20 04/04/2019 1327   BUN 26 (H) 07/24/2017 1554   CREATININE 0.86 04/04/2019 1327   CREATININE 0.94 06/24/2016 1223      Component Value Date/Time   CALCIUM 9.0 04/04/2019 1327   ALKPHOS 57 04/04/2019 1327   AST 27 04/04/2019 1327   ALT 22 04/04/2019 1327   BILITOT 0.4 04/04/2019 1327       Impression and Plan: Mr. Timothy Hoffman is a very pleasant 59 yo caucasian gentleman with iron deficiency anemia secondary to blood loss with hemorrhoids. Since his hemorrhoidectomy in September 2020 he has done well.  We will see what his iron studies look like and replace if needed.  We will plan to see him back in another 8 months.  He will contact our office with any questions or concerns. We can certainly see him sooner if needed.   Laverna Peace, NP 2/8/20212:36 PM

## 2019-12-13 ENCOUNTER — Telehealth: Payer: Self-pay | Admitting: Hematology & Oncology

## 2019-12-13 LAB — IRON AND TIBC
Iron: 111 ug/dL (ref 42–163)
Saturation Ratios: 30 % (ref 20–55)
TIBC: 374 ug/dL (ref 202–409)
UIBC: 263 ug/dL (ref 117–376)

## 2019-12-13 LAB — FERRITIN: Ferritin: 58 ng/mL (ref 24–336)

## 2019-12-13 NOTE — Telephone Encounter (Signed)
Appointments scheduled calendar mailed per 2/5 los

## 2019-12-14 ENCOUNTER — Other Ambulatory Visit: Payer: Self-pay | Admitting: Family Medicine

## 2019-12-14 DIAGNOSIS — E789 Disorder of lipoprotein metabolism, unspecified: Secondary | ICD-10-CM

## 2019-12-14 MED FILL — EZETIMIBE 10 MG TABS: 10 | 90 days supply | Qty: 90 | Fill #0

## 2019-12-19 ENCOUNTER — Other Ambulatory Visit: Payer: Self-pay | Admitting: Family Medicine

## 2019-12-19 ENCOUNTER — Telehealth: Payer: Self-pay

## 2019-12-19 DIAGNOSIS — F411 Generalized anxiety disorder: Secondary | ICD-10-CM

## 2019-12-19 NOTE — Telephone Encounter (Signed)
Patient called in to see if Dr. Charlett Blake  Can send in a prescription for  sertraline (ZOLOFT) 25 MG tablet    Please follow up with the patient at [phone]   Thanks,

## 2019-12-20 MED FILL — SERTRALINE HCL 25 MG TABLET: 25 | 90 days supply | Qty: 90 | Fill #0

## 2019-12-20 NOTE — Telephone Encounter (Signed)
LMOVM that Rx was sent to pharm/thx dmf

## 2019-12-21 ENCOUNTER — Encounter: Payer: Self-pay | Admitting: Family

## 2019-12-26 ENCOUNTER — Ambulatory Visit: Payer: No Typology Code available for payment source | Attending: Internal Medicine

## 2019-12-26 DIAGNOSIS — Z20822 Contact with and (suspected) exposure to covid-19: Secondary | ICD-10-CM

## 2019-12-27 LAB — NOVEL CORONAVIRUS, NAA: SARS-CoV-2, NAA: NOT DETECTED

## 2020-01-09 ENCOUNTER — Ambulatory Visit: Payer: No Typology Code available for payment source | Attending: Internal Medicine

## 2020-01-09 DIAGNOSIS — Z20822 Contact with and (suspected) exposure to covid-19: Secondary | ICD-10-CM

## 2020-01-10 LAB — NOVEL CORONAVIRUS, NAA: SARS-CoV-2, NAA: NOT DETECTED

## 2020-03-20 ENCOUNTER — Other Ambulatory Visit: Payer: Self-pay | Admitting: Family

## 2020-03-22 ENCOUNTER — Telehealth: Payer: Self-pay | Admitting: Family Medicine

## 2020-03-22 DIAGNOSIS — E789 Disorder of lipoprotein metabolism, unspecified: Secondary | ICD-10-CM

## 2020-03-22 DIAGNOSIS — F411 Generalized anxiety disorder: Secondary | ICD-10-CM

## 2020-03-22 MED ORDER — EZETIMIBE 10 MG PO TABS: 10.0000 mg | ORAL_TABLET | Freq: Every day | ORAL | 0 refills | Status: DC

## 2020-03-22 MED ORDER — ROSUVASTATIN CALCIUM 40 MG PO TABS: 40.0000 mg | ORAL_TABLET | Freq: Every day | ORAL | 0 refills | Status: DC

## 2020-03-22 MED ORDER — SERTRALINE HCL 25 MG PO TABS: ORAL_TABLET | ORAL | 0 refills | Status: DC

## 2020-03-22 MED FILL — EZETIMIBE 10 MG TABS: 10 | 90 days supply | Qty: 90 | Fill #0

## 2020-03-22 MED FILL — ROSUVASTATIN CALCIUM 40 MG: 40 | 90 days supply | Qty: 90 | Fill #0

## 2020-03-22 MED FILL — SERTRALINE HCL 25 MG TABLET: 25 | 90 days supply | Qty: 90 | Fill #0

## 2020-03-22 NOTE — Telephone Encounter (Signed)
Medication: I need to get my meds refilled and I promised an appointment. 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 In the meantime, could I get refills on my Crestor, Zetia, and Sertraline please?    (Appointment has been made for July)  Has the patient contacted their pharmacy? No. (If no, request that the patient contact the pharmacy for the refill.) (If yes, when and what did the pharmacy advise?)  Preferred Pharmacy (with phone number or street name): [place], [place] - [facility].  [address] [place] [address]  Phone:  [phone] Fax:  [phone]  DEA #:  --  Agent: Please be advised that RX refills may take up to 3 business days. We ask that you follow-up with your pharmacy.

## 2020-03-22 NOTE — Telephone Encounter (Signed)
Left message on machine that rxs have been sent in. 

## 2020-05-10 ENCOUNTER — Other Ambulatory Visit: Payer: Self-pay

## 2020-05-10 ENCOUNTER — Encounter: Payer: Self-pay | Admitting: Family Medicine

## 2020-05-10 ENCOUNTER — Other Ambulatory Visit: Payer: Self-pay | Admitting: Family Medicine

## 2020-05-10 ENCOUNTER — Ambulatory Visit (INDEPENDENT_AMBULATORY_CARE_PROVIDER_SITE_OTHER): Payer: No Typology Code available for payment source | Admitting: Family Medicine

## 2020-05-10 VITALS — BP 146/98 | HR 84 | Temp 98.8°F | Resp 12 | Ht 70.0 in | Wt 189.0 lb

## 2020-05-10 DIAGNOSIS — R030 Elevated blood-pressure reading, without diagnosis of hypertension: Secondary | ICD-10-CM

## 2020-05-10 DIAGNOSIS — D508 Other iron deficiency anemias: Secondary | ICD-10-CM

## 2020-05-10 DIAGNOSIS — Z9889 Other specified postprocedural states: Secondary | ICD-10-CM

## 2020-05-10 DIAGNOSIS — Z0000 Encounter for general adult medical examination without abnormal findings: Secondary | ICD-10-CM

## 2020-05-10 DIAGNOSIS — F411 Generalized anxiety disorder: Secondary | ICD-10-CM | POA: Diagnosis not present

## 2020-05-10 DIAGNOSIS — E789 Disorder of lipoprotein metabolism, unspecified: Secondary | ICD-10-CM

## 2020-05-10 DIAGNOSIS — Z86010 Personal history of colonic polyps: Secondary | ICD-10-CM

## 2020-05-10 DIAGNOSIS — D649 Anemia, unspecified: Secondary | ICD-10-CM

## 2020-05-10 MED ORDER — SERTRALINE HCL 50 MG PO TABS: 50.0000 mg | ORAL_TABLET | Freq: Every day | ORAL | 1 refills | Status: DC

## 2020-05-10 MED FILL — SERTRALINE HCL 50 MG TABLET: 50 | 90 days supply | Qty: 90 | Fill #0

## 2020-05-10 NOTE — Assessment & Plan Note (Addendum)
Sertraline 50 mg daily increased to due to increased stress

## 2020-05-10 NOTE — Patient Instructions (Signed)
Deep breathing exercise twice daily and report if numbers remain elevated   Hypertension, Adult High blood pressure (hypertension) is when the force of blood pumping through the arteries is too strong. The arteries are the blood vessels that carry blood from the heart throughout the body. Hypertension forces the heart to work harder to pump blood and may cause arteries to become narrow or stiff. Untreated or uncontrolled hypertension can cause a heart attack, heart failure, a stroke, kidney disease, and other problems. A blood pressure reading consists of a higher number over a lower number. Ideally, your blood pressure should be below 120/80. The first ("top") number is called the systolic pressure. It is a measure of the pressure in your arteries as your heart beats. The second ("bottom") number is called the diastolic pressure. It is a measure of the pressure in your arteries as the heart relaxes. What are the causes? The exact cause of this condition is not known. There are some conditions that result in or are related to high blood pressure. What increases the risk? Some risk factors for high blood pressure are under your control. The following factors may make you more likely to develop this condition:  Smoking.  Having type 2 diabetes mellitus, high cholesterol, or both.  Not getting enough exercise or physical activity.  Being overweight.  Having too much fat, sugar, calories, or salt (sodium) in your diet.  Drinking too much alcohol. Some risk factors for high blood pressure may be difficult or impossible to change. Some of these factors include:  Having chronic kidney disease.  Having a family history of high blood pressure.  Age. Risk increases with age.  Race. You may be at higher risk if you are African American.  Gender. Men are at higher risk than women before age 35. After age 82, women are at higher risk than men.  Having obstructive sleep apnea.  Stress. What  are the signs or symptoms? High blood pressure may not cause symptoms. Very high blood pressure (hypertensive crisis) may cause:  Headache.  Anxiety.  Shortness of breath.  Nosebleed.  Nausea and vomiting.  Vision changes.  Severe chest pain.  Seizures. How is this diagnosed? This condition is diagnosed by measuring your blood pressure while you are seated, with your arm resting on a flat surface, your legs uncrossed, and your feet flat on the floor. The cuff of the blood pressure monitor will be placed directly against the skin of your upper arm at the level of your heart. It should be measured at least twice using the same arm. Certain conditions can cause a difference in blood pressure between your right and left arms. Certain factors can cause blood pressure readings to be lower or higher than normal for a short period of time:  When your blood pressure is higher when you are in a health care provider's office than when you are at home, this is called white coat hypertension. Most people with this condition do not need medicines.  When your blood pressure is higher at home than when you are in a health care provider's office, this is called masked hypertension. Most people with this condition may need medicines to control blood pressure. If you have a high blood pressure reading during one visit or you have normal blood pressure with other risk factors, you may be asked to:  Return on a different day to have your blood pressure checked again.  Monitor your blood pressure at home for 1 week or  longer. If you are diagnosed with hypertension, you may have other blood or imaging tests to help your health care provider understand your overall risk for other conditions. How is this treated? This condition is treated by making healthy lifestyle changes, such as eating healthy foods, exercising more, and reducing your alcohol intake. Your health care provider may prescribe medicine if  lifestyle changes are not enough to get your blood pressure under control, and if:  Your systolic blood pressure is above 130.  Your diastolic blood pressure is above 80. Your personal target blood pressure may vary depending on your medical conditions, your age, and other factors. Follow these instructions at home: Eating and drinking   Eat a diet that is high in fiber and potassium, and low in sodium, added sugar, and fat. An example eating plan is called the DASH (Dietary Approaches to Stop Hypertension) diet. To eat this way: ? Eat plenty of fresh fruits and vegetables. Try to fill one half of your plate at each meal with fruits and vegetables. ? Eat whole grains, such as whole-wheat pasta, brown rice, or whole-grain bread. Fill about one fourth of your plate with whole grains. ? Eat or drink low-fat dairy products, such as skim milk or low-fat yogurt. ? Avoid fatty cuts of meat, processed or cured meats, and poultry with skin. Fill about one fourth of your plate with lean proteins, such as fish, chicken without skin, beans, eggs, or tofu. ? Avoid pre-made and processed foods. These tend to be higher in sodium, added sugar, and fat.  Reduce your daily sodium intake. Most people with hypertension should eat less than 1,500 mg of sodium a day.  Do not drink alcohol if: ? Your health care provider tells you not to drink. ? You are pregnant, may be pregnant, or are planning to become pregnant.  If you drink alcohol: ? Limit how much you use to:  0-1 drink a day for women.  0-2 drinks a day for men. ? Be aware of how much alcohol is in your drink. In the [place] one drink equals one 12 oz bottle of beer (355 mL), one 5 oz glass of wine (148 mL), or one 1 oz glass of hard liquor (44 mL). Lifestyle   Work with your health care provider to maintain a healthy body weight or to lose weight. Ask what an ideal weight is for you.  Get at least 30 minutes of exercise most days of the week.  Activities may include walking, swimming, or biking.  Include exercise to strengthen your muscles (resistance exercise), such as Pilates or lifting weights, as part of your weekly exercise routine. Try to do these types of exercises for 30 minutes at least 3 days a week.  Do not use any products that contain nicotine or tobacco, such as cigarettes, e-cigarettes, and chewing tobacco. If you need help quitting, ask your health care provider.  Monitor your blood pressure at home as told by your health care provider.  Keep all follow-up visits as told by your health care provider. This is important. Medicines  Take over-the-counter and prescription medicines only as told by your health care provider. Follow directions carefully. Blood pressure medicines must be taken as prescribed.  Do not skip doses of blood pressure medicine. Doing this puts you at risk for problems and can make the medicine less effective.  Ask your health care provider about side effects or reactions to medicines that you should watch for. Contact a health care provider  if you:  Think you are having a reaction to a medicine you are taking.  Have headaches that keep coming back (recurring).  Feel dizzy.  Have swelling in your ankles.  Have trouble with your vision. Get help right away if you:  Develop a severe headache or confusion.  Have unusual weakness or numbness.  Feel faint.  Have severe pain in your chest or abdomen.  Vomit repeatedly.  Have trouble breathing. Summary  Hypertension is when the force of blood pumping through your arteries is too strong. If this condition is not controlled, it may put you at risk for serious complications.  Your personal target blood pressure may vary depending on your medical conditions, your age, and other factors. For most people, a normal blood pressure is less than 120/80.  Hypertension is treated with lifestyle changes, medicines, or a combination of both.  Lifestyle changes include losing weight, eating a healthy, low-sodium diet, exercising more, and limiting alcohol. This information is not intended to replace advice given to you by your health care provider. Make sure you discuss any questions you have with your health care provider. Document Revised: 06/30/2018 Document Reviewed: 06/30/2018 Elsevier Patient Education  2020 [employer].

## 2020-05-10 NOTE — Assessment & Plan Note (Addendum)
Encouraged heart healthy diet, increase exercise, avoid trans fats, consider a krill oil cap daily. Tolerating Rosuvastatin 

## 2020-05-11 ENCOUNTER — Other Ambulatory Visit: Payer: Self-pay | Admitting: *Deleted

## 2020-05-11 DIAGNOSIS — R739 Hyperglycemia, unspecified: Secondary | ICD-10-CM

## 2020-05-11 DIAGNOSIS — E789 Disorder of lipoprotein metabolism, unspecified: Secondary | ICD-10-CM

## 2020-05-11 DIAGNOSIS — R7401 Elevation of levels of liver transaminase levels: Secondary | ICD-10-CM

## 2020-05-11 LAB — LDL CHOLESTEROL, DIRECT: Direct LDL: 114 mg/dL

## 2020-05-11 LAB — COMPREHENSIVE METABOLIC PANEL
ALT: 41 U/L (ref 0–53)
AST: 40 U/L — ABNORMAL HIGH (ref 0–37)
Albumin: 4.5 g/dL (ref 3.5–5.2)
Alkaline Phosphatase: 63 U/L (ref 39–117)
BUN: 21 mg/dL (ref 6–23)
CO2: 27 mEq/L (ref 19–32)
Calcium: 9.8 mg/dL (ref 8.4–10.5)
Chloride: 103 mEq/L (ref 96–112)
Creatinine, Ser: 0.84 mg/dL (ref 0.40–1.50)
GFR: 93.04 mL/min (ref >60.00)
Glucose, Bld: 98 mg/dL (ref 70–99)
Potassium: 4.5 mEq/L (ref 3.5–5.1)
Sodium: 140 mEq/L (ref 135–145)
Total Bilirubin: 0.9 mg/dL (ref 0.2–1.2)
Total Protein: 7.2 g/dL (ref 6.0–8.3)

## 2020-05-11 LAB — CBC
HCT: 44.2 % (ref 39.0–52.0)
Hemoglobin: 15.2 g/dL (ref 13.0–17.0)
MCHC: 34.4 g/dL (ref 30.0–36.0)
MCV: 93.2 fl (ref 78.0–100.0)
Platelets: 245 10*3/uL (ref 150.0–400.0)
RBC: 4.75 Mil/uL (ref 4.22–5.81)
RDW: 13.4 % (ref 11.5–15.5)
WBC: 5.5 10*3/uL (ref 4.0–10.5)

## 2020-05-11 LAB — LIPID PANEL
Cholesterol: 212 mg/dL — ABNORMAL HIGH (ref 0–200)
HDL: 58 mg/dL (ref >39.00)
NonHDL: 154.3
Total CHOL/HDL Ratio: 4
Triglycerides: 256 mg/dL — ABNORMAL HIGH (ref 0.0–149.0)
VLDL: 51.2 mg/dL — ABNORMAL HIGH (ref 0.0–40.0)

## 2020-05-11 LAB — TSH: TSH: 1.93 u[IU]/mL (ref 0.35–4.50)

## 2020-05-14 DIAGNOSIS — Z86010 Personal history of colonic polyps: Secondary | ICD-10-CM | POA: Insufficient documentation

## 2020-05-14 NOTE — Progress Notes (Signed)
Subjective:    Patient ID: Timothy Hoffman, male    DOB: 1961/07/21, 59 y.o.   MRN: [id number]  Chief Complaint  Patient presents with  . Follow-up    HPI Patient is in today for follow up on chronic medical concerns. No recent febrile illness or hospitalizations. He has been trying to maintain a heart healthy diet and to stay active. He is tolerating his meds. Denies CP/palp/SOB/HA/congestion/fevers/GI or GU c/o. Taking meds as prescribed  Past Medical History:  Diagnosis Date  . Allergy    SEASONAL  . Anemia    IV iron x3 05/2019  . Anxiety   . Arrhythmia 1999  . Atopic dermatitis   . Diverticulosis   . History of colon polyps   . Hyperlipemia   . Internal hemorrhoid, bleeding 2013  . PONV (postoperative nausea and vomiting)     Past Surgical History:  Procedure Laterality Date  . COLONOSCOPY    . EVALUATION UNDER ANESTHESIA WITH HEMORRHOIDECTOMY N/A 07/07/2019   Procedure: HEMORRHOIDECTOMY, HEMORRHOIDAL LIGATION/PEXY ANORECTAL EXAM UNDER ANESTHESIA;  Surgeon: Michael Boston, MD;  Location: [facility];  Service: General;  Laterality: N/A;  LOCAL  . KNEE ARTHROSCOPY Right 2012   Meniscal repair  . Right shoulder scope    . WISDOM TOOTH EXTRACTION      Family History  Problem Relation Age of Onset  . Lymphoma Father   . Cancer Father        nonHodgkin's lymphoma  . Hyperlipidemia Mother   . Hypertension Mother   . Hypertension Brother   . Hyperlipidemia Brother   . Mental illness Son        Anxiety  . Anxiety disorder Son   . Stroke Maternal Grandmother   . Colon cancer Neg Hx     Social History   Socioeconomic History  . Marital status: Married    Spouse name: Erline Levine  . Number of children: 2  . Years of education: college  . Highest education level: Not on file  Occupational History  . Occupation: Automotive engineer: [employer]  Tobacco Use  . Smoking status: Never Smoker  . Smokeless tobacco: Never Used  . Tobacco comment: 20 yrs ago     Vaping Use  . Vaping Use: Never used  Substance and Sexual Activity  . Alcohol use: Yes    Alcohol/week: 2.0 standard drinks    Types: 2 Cans of beer per week  . Drug use: No  . Sexual activity: Yes    Partners: Female  Other Topics Concern  . Not on file  Social History Narrative   Lives with his wife and their 2 children.     His wife is an Therapist, sports.     Their son has significant anxiety.   He exercises by running 3-5 miles 4x/week.      Vegan   Social Determinants of Health   Financial Resource Strain:   . Difficulty of Paying Living Expenses:   Food Insecurity:   . Worried About Charity fundraiser in the Last Year:   . Arboriculturist in the Last Year:   Transportation Needs:   . Film/video editor (Medical):   Marland Kitchen Lack of Transportation (Non-Medical):   Physical Activity:   . Days of Exercise per Week:   . Minutes of Exercise per Session:   Stress:   . Feeling of Stress :   Social Connections:   . Frequency of Communication with Friends and Family:   .  Frequency of Social Gatherings with Friends and Family:   . Attends Religious Services:   . Active Member of Clubs or Organizations:   . Attends Archivist Meetings:   Marland Kitchen Marital Status:   Intimate Partner Violence:   . Fear of Current or Ex-Partner:   . Emotionally Abused:   Marland Kitchen Physically Abused:   . Sexually Abused:     Outpatient Medications Prior to Visit  Medication Sig Dispense Refill  . ezetimibe (ZETIA) 10 MG tablet Take 1 tablet (10 mg total) by mouth daily. 90 tablet 0  . Ferrous Fumarate (IRON) 18 MG TBCR Take 18 mg by mouth daily.    . folic acid (FOLVITE) 1 MG tablet Take 1 tablet (1 mg total) by mouth daily. 30 tablet 6  . hydrocortisone cream 1 % Apply 1 application topically daily as needed (poison ivy).    Marland Kitchen ibuprofen (ADVIL) 200 MG tablet Take 600 mg by mouth every 6 (six) hours as needed for headache or moderate pain.    Marland Kitchen loratadine (CLARITIN) 10 MG tablet Take 10 mg by mouth daily  as needed for allergies.    . Omega-3 Fatty Acids (FISH OIL PO) Take 1,280 mg by mouth 2 (two) times a week.    . rosuvastatin (CRESTOR) 40 MG tablet Take 1 tablet (40 mg total) by mouth daily. 90 tablet 0  . sildenafil (REVATIO) 20 MG tablet Take 3 tablets by mouth as needed.    . triamcinolone cream (KENALOG) 0.1 % Apply topically daily. (Patient taking differently: Apply 1 application topically daily as needed (atopic dermatitis). ) 80 g 1  . sertraline (ZOLOFT) 25 MG tablet TAKE 1 TABLET BY MOUTH DAILY. 90 tablet 0   No facility-administered medications prior to visit.    Allergies  Allergen Reactions  . Rose Hips [Ascorbate] Itching and Rash  . Tequin Rash    Review of Systems  Constitutional: Negative for fever and malaise/fatigue.  HENT: Negative for congestion.   Eyes: Negative for blurred vision.  Respiratory: Negative for shortness of breath.   Cardiovascular: Negative for chest pain, palpitations and leg swelling.  Gastrointestinal: Negative for abdominal pain, blood in stool and nausea.  Genitourinary: Negative for dysuria and frequency.  Musculoskeletal: Negative for falls.  Skin: Negative for rash.  Neurological: Negative for dizziness, loss of consciousness and headaches.  Endo/Heme/Allergies: Negative for environmental allergies.  Psychiatric/Behavioral: Negative for depression. The patient is not nervous/anxious.        Objective:    Physical Exam Vitals and nursing note reviewed.  Constitutional:      General: He is not in acute distress.    Appearance: He is well-developed.  HENT:     Head: Normocephalic and atraumatic.     Nose: Nose normal.  Eyes:     General:        Right eye: No discharge.        Left eye: No discharge.  Cardiovascular:     Rate and Rhythm: Normal rate and regular rhythm.     Heart sounds: No murmur heard.   Pulmonary:     Effort: Pulmonary effort is normal.     Breath sounds: Normal breath sounds.  Abdominal:     General:  Bowel sounds are normal.     Palpations: Abdomen is soft.     Tenderness: There is no abdominal tenderness.  Musculoskeletal:     Cervical back: Normal range of motion and neck supple.  Skin:    General: Skin is warm and  dry.  Neurological:     Mental Status: He is alert and oriented to person, place, and time.     BP (!) 146/98 (BP Location: Right Arm, Cuff Size: Large)   Pulse 84   Temp 98.8 F (37.1 C) (Oral)   Resp 12   Ht 5\' 10"  (1.778 m)   Wt 189 lb (85.7 kg)   SpO2 98%   BMI 27.12 kg/m  Wt Readings from Last 3 Encounters:  05/10/20 189 lb (85.7 kg)  12/12/19 189 lb 1.9 oz (85.8 kg)  08/11/19 187 lb 12.8 oz (85.2 kg)    Diabetic Foot Exam - Simple   No data filed     Lab Results  Component Value Date   WBC 5.5 05/10/2020   HGB 15.2 05/10/2020   HCT 44.2 05/10/2020   PLT 245.0 05/10/2020   GLUCOSE 98 05/10/2020   CHOL 212 (H) 05/10/2020   TRIG 256.0 (H) 05/10/2020   HDL 58.00 05/10/2020   LDLDIRECT 114.0 05/10/2020   LDLCALC 85 03/15/2019   ALT 41 05/10/2020   AST 40 (H) 05/10/2020   NA 140 05/10/2020   K 4.5 05/10/2020   CL 103 05/10/2020   CREATININE 0.84 05/10/2020   BUN 21 05/10/2020   CO2 27 05/10/2020   TSH 1.93 05/10/2020   PSA 2.24 09/06/2018    Lab Results  Component Value Date   TSH 1.93 05/10/2020   Lab Results  Component Value Date   WBC 5.5 05/10/2020   HGB 15.2 05/10/2020   HCT 44.2 05/10/2020   MCV 93.2 05/10/2020   PLT 245.0 05/10/2020   Lab Results  Component Value Date   NA 140 05/10/2020   K 4.5 05/10/2020   CO2 27 05/10/2020   GLUCOSE 98 05/10/2020   BUN 21 05/10/2020   CREATININE 0.84 05/10/2020   BILITOT 0.9 05/10/2020   ALKPHOS 63 05/10/2020   AST 40 (H) 05/10/2020   ALT 41 05/10/2020   PROT 7.2 05/10/2020   ALBUMIN 4.5 05/10/2020   CALCIUM 9.8 05/10/2020   ANIONGAP 9 04/04/2019   GFR 93.04 05/10/2020   Lab Results  Component Value Date   CHOL 212 (H) 05/10/2020   Lab Results  Component Value Date     HDL 58.00 05/10/2020   Lab Results  Component Value Date   LDLCALC 85 03/15/2019   Lab Results  Component Value Date   TRIG 256.0 (H) 05/10/2020   Lab Results  Component Value Date   CHOLHDL 4 05/10/2020   No results found for: HGBA1C     Assessment & Plan:   Problem List Items Addressed This Visit    Generalized anxiety disorder (Chronic)    Sertraline 50 mg daily increased to due to increased stress      Relevant Medications   sertraline (ZOLOFT) 50 MG tablet   Other Relevant Orders   TSH (Completed)   Lipid disorder    Encouraged heart healthy diet, increase exercise, avoid trans fats, consider a krill oil cap daily. Tolerating Rosuvastatin.       Relevant Orders   Lipid panel (Completed)   Comprehensive metabolic panel (Completed)   TSH (Completed)   Elevated blood-pressure reading without diagnosis of hypertension    Mildly elevated today. Encouraged heart healthy diet such as the DASH diet, minimizing caffeine, alcohol and sodium and exercise as tolerated. Monitor blood pressure and perform deep breathing exercises several times a day. Report if blood pressure remains elevated.       Preventative health care  RESOLVED: Anemia - Primary   Relevant Orders   CBC (Completed)   TSH (Completed)   IDA (iron deficiency anemia)    Increase leafy greens, consider increased lean red meat and using cast iron cookware. Continue to monitor, report any concerns      H/O colonoscopy with polypectomy    Referred to gastroenterology for repeat surveillance.       Relevant Orders   Ambulatory referral to Gastroenterology      I have discontinued Chan H. Breeze's sertraline. I am also having him start on sertraline. Additionally, I am having him maintain his triamcinolone cream, folic acid, Iron, Omega-3 Fatty Acids (FISH OIL PO), ibuprofen, loratadine, hydrocortisone cream, sildenafil, ezetimibe, and rosuvastatin.  Meds ordered this encounter  Medications  .  sertraline (ZOLOFT) 50 MG tablet    Sig: Take 1 tablet (50 mg total) by mouth daily.    Dispense:  90 tablet    Refill:  1     Penni Homans, MD

## 2020-05-14 NOTE — Assessment & Plan Note (Signed)
Referred to gastroenterology for repeat surveillance.

## 2020-05-14 NOTE — Assessment & Plan Note (Signed)
Mildly elevated today. Encouraged heart healthy diet such as the DASH diet, minimizing caffeine, alcohol and sodium and exercise as tolerated. Monitor blood pressure and perform deep breathing exercises several times a day. Report if blood pressure remains elevated.

## 2020-05-14 NOTE — Assessment & Plan Note (Signed)
Increase leafy greens, consider increased lean red meat and using cast iron cookware. Continue to monitor, report any concerns 

## 2020-06-15 ENCOUNTER — Other Ambulatory Visit: Payer: Self-pay | Admitting: Family Medicine

## 2020-06-15 DIAGNOSIS — E789 Disorder of lipoprotein metabolism, unspecified: Secondary | ICD-10-CM

## 2020-06-15 MED FILL — ROSUVASTATIN CALCIUM 40 MG: 40 | 90 days supply | Qty: 90 | Fill #0

## 2020-06-15 MED FILL — EZETIMIBE 10 MG TABS: 10 | 90 days supply | Qty: 90 | Fill #0

## 2020-07-18 ENCOUNTER — Encounter: Payer: Self-pay | Admitting: Family Medicine

## 2020-08-10 ENCOUNTER — Inpatient Hospital Stay: Payer: No Typology Code available for payment source

## 2020-08-10 ENCOUNTER — Ambulatory Visit: Payer: No Typology Code available for payment source | Admitting: Hematology & Oncology

## 2020-09-03 ENCOUNTER — Other Ambulatory Visit (HOSPITAL_COMMUNITY): Payer: Self-pay | Admitting: Internal Medicine

## 2020-09-03 MED FILL — FLUARIX QUADRIVALENT 0.5 ML: 0.5 | 1 days supply | Qty: 1 | Fill #0

## 2020-09-18 ENCOUNTER — Other Ambulatory Visit: Payer: Self-pay | Admitting: Family Medicine

## 2020-09-18 DIAGNOSIS — E789 Disorder of lipoprotein metabolism, unspecified: Secondary | ICD-10-CM

## 2020-09-18 MED FILL — EZETIMIBE 10 MG TABS: 10 | 90 days supply | Qty: 90 | Fill #0

## 2020-09-18 MED FILL — ROSUVASTATIN CALCIUM 40 MG: 40 | 90 days supply | Qty: 90 | Fill #0

## 2020-09-20 ENCOUNTER — Telehealth: Payer: Self-pay

## 2020-09-20 ENCOUNTER — Encounter: Payer: Self-pay | Admitting: Family Medicine

## 2020-09-20 NOTE — Telephone Encounter (Signed)
Pt called in to r/s his cancelled appt from 08/10/20,    AOM

## 2020-09-25 ENCOUNTER — Encounter: Payer: Self-pay | Admitting: Family Medicine

## 2020-09-26 ENCOUNTER — Other Ambulatory Visit: Payer: Self-pay | Admitting: Family Medicine

## 2020-09-26 MED ORDER — AMLODIPINE BESYLATE 2.5 MG PO TABS
2.5000 mg | ORAL_TABLET | Freq: Every day | ORAL | 1 refills | Status: DC
Start: 2020-09-26 — End: 2020-10-08

## 2020-09-26 MED FILL — AMLODIPINE 2.5 MG TABLET: 2.5 | 30 days supply | Qty: 30 | Fill #0

## 2020-10-02 ENCOUNTER — Telehealth: Payer: Self-pay | Admitting: Family

## 2020-10-02 ENCOUNTER — Encounter: Payer: Self-pay | Admitting: Family

## 2020-10-02 ENCOUNTER — Inpatient Hospital Stay: Payer: No Typology Code available for payment source | Attending: Hematology & Oncology

## 2020-10-02 ENCOUNTER — Inpatient Hospital Stay (HOSPITAL_BASED_OUTPATIENT_CLINIC_OR_DEPARTMENT_OTHER): Payer: No Typology Code available for payment source | Admitting: Family

## 2020-10-02 ENCOUNTER — Other Ambulatory Visit: Payer: Self-pay

## 2020-10-02 VITALS — HR 88 | Temp 97.9°F | Resp 16

## 2020-10-02 DIAGNOSIS — D500 Iron deficiency anemia secondary to blood loss (chronic): Secondary | ICD-10-CM

## 2020-10-02 DIAGNOSIS — K649 Unspecified hemorrhoids: Secondary | ICD-10-CM

## 2020-10-02 DIAGNOSIS — Z79899 Other long term (current) drug therapy: Secondary | ICD-10-CM | POA: Insufficient documentation

## 2020-10-02 LAB — CBC WITH DIFFERENTIAL (CANCER CENTER ONLY)
Abs Immature Granulocytes: 0.05 10*3/uL (ref 0.00–0.07)
Basophils Absolute: 0 10*3/uL (ref 0.0–0.1)
Basophils Relative: 1 %
Eosinophils Absolute: 0.1 10*3/uL (ref 0.0–0.5)
Eosinophils Relative: 1 %
HCT: 46.4 % (ref 39.0–52.0)
Hemoglobin: 15.4 g/dL (ref 13.0–17.0)
Immature Granulocytes: 1 %
Lymphocytes Relative: 21 %
Lymphs Abs: 0.9 10*3/uL (ref 0.7–4.0)
MCH: 31.2 pg (ref 26.0–34.0)
MCHC: 33.2 g/dL (ref 30.0–36.0)
MCV: 94.1 fL (ref 80.0–100.0)
Monocytes Absolute: 0.6 10*3/uL (ref 0.1–1.0)
Monocytes Relative: 14 %
Neutro Abs: 2.7 10*3/uL (ref 1.7–7.7)
Neutrophils Relative %: 62 %
Platelet Count: 250 10*3/uL (ref 150–400)
RBC: 4.93 MIL/uL (ref 4.22–5.81)
RDW: 12.5 % (ref 11.5–15.5)
WBC Count: 4.4 10*3/uL (ref 4.0–10.5)
nRBC: 0 % (ref 0.0–0.2)

## 2020-10-02 LAB — FERRITIN: Ferritin: 75 ng/mL (ref 24–336)

## 2020-10-02 LAB — RETICULOCYTES
Immature Retic Fract: 9.9 % (ref 2.3–15.9)
RBC.: 4.97 MIL/uL (ref 4.22–5.81)
Retic Count, Absolute: 110.8 10*3/uL (ref 19.0–186.0)
Retic Ct Pct: 2.2 % (ref 0.4–3.1)

## 2020-10-02 LAB — IRON AND TIBC
Iron: 106 ug/dL (ref 42–163)
Saturation Ratios: 25 % (ref 20–55)
TIBC: 424 ug/dL — ABNORMAL HIGH (ref 202–409)
UIBC: 317 ug/dL (ref 117–376)

## 2020-10-02 NOTE — Progress Notes (Signed)
Hematology and Oncology Follow Up Visit  VONG GARRINGER [id number] 30-Dec-1960 59 y.o. 10/02/2020   Principle Diagnosis:  Iron deficiency anemia  Current Therapy:        IV iron as indicated   Interim History:  Mr. Candelas is here today for follow-up. He is doing well but has been under some family stress. His mother recently had a cardiac event but thankfully is doing much better.  His blood pressure has been elevated and he states that he started Amlodipine yesterday. He has a follow-up with his PCP next week for follow-up. He is checking his BP daily and keeping a record.  No fever, chills, n/v, cough, rash, dizziness, SOB, chest pain, palpitations, abdominal pain or changes in bowel or bladder habits.  No blood loss noted. No abnormal bruising or petechiae.  No swelling, tenderness, numbness or tingling in his extremities.  No falls or syncope.  He has maintained a good appetite and is staying well hydrated. His weight is stable.   ECOG Performance Status: 1 - Symptomatic but completely ambulatory  Medications:  Allergies as of 10/02/2020      Reactions   Rose Hips [ascorbate] Itching, Rash   Tequin Rash      Medication List       Accurate as of October 02, 2020  9:47 AM. If you have any questions, ask your nurse or doctor.        amLODipine 2.5 MG tablet Commonly known as: NORVASC Take 1 tablet (2.5 mg total) by mouth daily.   ezetimibe 10 MG tablet Commonly known as: ZETIA TAKE 1 TABLET (10 MG TOTAL) BY MOUTH DAILY.   FISH OIL PO Take 1,280 mg by mouth 2 (two) times a week.   folic acid 1 MG tablet Commonly known as: FOLVITE Take 1 tablet (1 mg total) by mouth daily.   hydrocortisone cream 1 % Apply 1 application topically daily as needed (poison ivy).   ibuprofen 200 MG tablet Commonly known as: ADVIL Take 600 mg by mouth every 6 (six) hours as needed for headache or moderate pain.   Iron 18 MG Tbcr Take 18 mg by mouth daily.   loratadine 10 MG  tablet Commonly known as: CLARITIN Take 10 mg by mouth daily as needed for allergies.   rosuvastatin 40 MG tablet Commonly known as: CRESTOR TAKE 1 TABLET (40 MG TOTAL) BY MOUTH DAILY.   sertraline 50 MG tablet Commonly known as: ZOLOFT Take 1 tablet (50 mg total) by mouth daily.   sildenafil 20 MG tablet Commonly known as: REVATIO Take 3 tablets by mouth as needed.   triamcinolone 0.1 % Commonly known as: KENALOG Apply topically daily. What changed:   how much to take  when to take this  reasons to take this       Allergies:  Allergies  Allergen Reactions  . Rose Hips [Ascorbate] Itching and Rash  . Tequin Rash    Past Medical History, Surgical history, Social history, and Family History were reviewed and updated.  Review of Systems: All other 10 point review of systems is negative.   Physical Exam:  vitals were not taken for this visit.   Wt Readings from Last 3 Encounters:  05/10/20 189 lb (85.7 kg)  12/12/19 189 lb 1.9 oz (85.8 kg)  08/11/19 187 lb 12.8 oz (85.2 kg)    Ocular: Sclerae unicteric, pupils equal, round and reactive to light Ear-nose-throat: Oropharynx clear, dentition fair Lymphatic: No cervical or supraclavicular adenopathy Lungs no rales or rhonchi,  good excursion bilaterally Heart regular rate and rhythm, no murmur appreciated Abd soft, nontender, positive bowel sounds, no liver or spleen tip palpated on exam, no fluid wave  MSK no focal spinal tenderness, no joint edema Neuro: non-focal, well-oriented, appropriate affect Breasts: Deferred   Lab Results  Component Value Date   WBC 4.4 10/02/2020   HGB 15.4 10/02/2020   HCT 46.4 10/02/2020   MCV 94.1 10/02/2020   PLT 250 10/02/2020   Lab Results  Component Value Date   FERRITIN 58 12/12/2019   IRON 111 12/12/2019   TIBC 374 12/12/2019   UIBC 263 12/12/2019   IRONPCTSAT 30 12/12/2019   Lab Results  Component Value Date   RETICCTPCT 2.2 10/02/2020   RBC 4.93 10/02/2020     RETICCTABS 64,680 03/15/2019   No results found for: KPAFRELGTCHN, LAMBDASER, KAPLAMBRATIO No results found for: IGGSERUM, IGA, IGMSERUM No results found for: Odetta Pink, SPEI   Chemistry      Component Value Date/Time   NA 140 05/10/2020 1413   NA 140 07/24/2017 1554   K 4.5 05/10/2020 1413   CL 103 05/10/2020 1413   CO2 27 05/10/2020 1413   BUN 21 05/10/2020 1413   BUN 26 (H) 07/24/2017 1554   CREATININE 0.84 05/10/2020 1413   CREATININE 0.86 04/04/2019 1327   CREATININE 0.94 06/24/2016 1223      Component Value Date/Time   CALCIUM 9.8 05/10/2020 1413   ALKPHOS 63 05/10/2020 1413   AST 40 (H) 05/10/2020 1413   AST 27 04/04/2019 1327   ALT 41 05/10/2020 1413   ALT 22 04/04/2019 1327   BILITOT 0.9 05/10/2020 1413   BILITOT 0.4 04/04/2019 1327       Impression and Plan: Mr. Kemler is a very pleasant 59 yo caucasian gentleman with iron deficiency anemia secondary to blood loss with hemorrhoids. Since his hemorrhoidectomy in September 2020 he has done well.  Iron studies are pending and we can replace if needed.  Follow-up in 8 months.  He was encouraged to contact our office with any questions or concerns.   Laverna Peace, NP 11/30/20219:47 AM

## 2020-10-02 NOTE — Telephone Encounter (Signed)
Appointments scheduled patient will get updates from My Chart per 11/30

## 2020-10-05 ENCOUNTER — Ambulatory Visit: Payer: No Typology Code available for payment source | Attending: Internal Medicine

## 2020-10-05 DIAGNOSIS — Z23 Encounter for immunization: Secondary | ICD-10-CM

## 2020-10-05 NOTE — Progress Notes (Signed)
   Covid-19 Vaccination Clinic  Name:  Timothy Hoffman    MRN: [id number] DOB: Feb 04, 1961  10/05/2020  Mr. Veselka was observed post Covid-19 immunization for 15 minutes without incident. He was provided with Vaccine Information Sheet and instruction to access the V-Safe system.   Mr. Fountain was instructed to call 911 with any severe reactions post vaccine: Marland Kitchen Difficulty breathing  . Swelling of face and throat  . A fast heartbeat  . A bad rash all over body  . Dizziness and weakness   Immunizations Administered    Name Date Dose VIS Date Route   Pfizer COVID-19 Vaccine 10/05/2020  3:09 PM 0.3 mL 08/22/2020 Intramuscular   Manufacturer: [employer]   Lot: [id number]   NDC: [address]-5

## 2020-10-08 ENCOUNTER — Other Ambulatory Visit: Payer: Self-pay

## 2020-10-08 ENCOUNTER — Ambulatory Visit (INDEPENDENT_AMBULATORY_CARE_PROVIDER_SITE_OTHER): Payer: No Typology Code available for payment source | Admitting: Family Medicine

## 2020-10-08 ENCOUNTER — Encounter: Payer: Self-pay | Admitting: Family Medicine

## 2020-10-08 ENCOUNTER — Other Ambulatory Visit: Payer: Self-pay | Admitting: Family Medicine

## 2020-10-08 DIAGNOSIS — Z86010 Personal history of colonic polyps: Secondary | ICD-10-CM

## 2020-10-08 DIAGNOSIS — F411 Generalized anxiety disorder: Secondary | ICD-10-CM

## 2020-10-08 DIAGNOSIS — D508 Other iron deficiency anemias: Secondary | ICD-10-CM

## 2020-10-08 DIAGNOSIS — I10 Essential (primary) hypertension: Secondary | ICD-10-CM

## 2020-10-08 DIAGNOSIS — E789 Disorder of lipoprotein metabolism, unspecified: Secondary | ICD-10-CM

## 2020-10-08 MED ORDER — AMLODIPINE BESYLATE 5 MG PO TABS: 5.0000 mg | ORAL_TABLET | Freq: Every day | ORAL | 1 refills | Status: DC

## 2020-10-08 MED FILL — AMLODIPINE BESYLATE 5 MG TA: 5 | 90 days supply | Qty: 90 | Fill #0

## 2020-10-08 NOTE — Patient Instructions (Signed)
MIND diet  DASH Eating Plan DASH stands for "Dietary Approaches to Stop Hypertension." The DASH eating plan is a healthy eating plan that has been shown to reduce high blood pressure (hypertension). It may also reduce your risk for type 2 diabetes, heart disease, and stroke. The DASH eating plan may also help with weight loss. What are tips for following this plan?  General guidelines  Avoid eating more than 2,300 mg (milligrams) of salt (sodium) a day. If you have hypertension, you may need to reduce your sodium intake to 1,500 mg a day.  Limit alcohol intake to no more than 1 drink a day for nonpregnant women and 2 drinks a day for men. One drink equals 12 oz of beer, 5 oz of wine, or 1 oz of hard liquor.  Work with your health care provider to maintain a healthy body weight or to lose weight. Ask what an ideal weight is for you.  Get at least 30 minutes of exercise that causes your heart to beat faster (aerobic exercise) most days of the week. Activities may include walking, swimming, or biking.  Work with your health care provider or diet and nutrition specialist (dietitian) to adjust your eating plan to your individual calorie needs. Reading food labels   Check food labels for the amount of sodium per serving. Choose foods with less than 5 percent of the Daily Value of sodium. Generally, foods with less than 300 mg of sodium per serving fit into this eating plan.  To find whole grains, look for the word "whole" as the first word in the ingredient list. Shopping  Buy products labeled as "low-sodium" or "no salt added."  Buy fresh foods. Avoid canned foods and premade or frozen meals. Cooking  Avoid adding salt when cooking. Use salt-free seasonings or herbs instead of table salt or sea salt. Check with your health care provider or pharmacist before using salt substitutes.  Do not fry foods. Cook foods using healthy methods such as baking, boiling, grilling, and broiling  instead.  Cook with heart-healthy oils, such as olive, canola, soybean, or sunflower oil. Meal planning  Eat a balanced diet that includes: ? 5 or more servings of fruits and vegetables each day. At each meal, try to fill half of your plate with fruits and vegetables. ? Up to 6-8 servings of whole grains each day. ? Less than 6 oz of lean meat, poultry, or fish each day. A 3-oz serving of meat is about the same size as a deck of cards. One egg equals 1 oz. ? 2 servings of low-fat dairy each day. ? A serving of nuts, seeds, or beans 5 times each week. ? Heart-healthy fats. Healthy fats called Omega-3 fatty acids are found in foods such as flaxseeds and coldwater fish, like sardines, salmon, and mackerel.  Limit how much you eat of the following: ? Canned or prepackaged foods. ? Food that is high in trans fat, such as fried foods. ? Food that is high in saturated fat, such as fatty meat. ? Sweets, desserts, sugary drinks, and other foods with added sugar. ? Full-fat dairy products.  Do not salt foods before eating.  Try to eat at least 2 vegetarian meals each week.  Eat more home-cooked food and less restaurant, buffet, and fast food.  When eating at a restaurant, ask that your food be prepared with less salt or no salt, if possible. What foods are recommended? The items listed may not be a complete list. Talk with  your dietitian about what dietary choices are best for you. Grains Whole-grain or whole-wheat bread. Whole-grain or whole-wheat pasta. Brown rice. [place] Morrow. Bulgur. Whole-grain and low-sodium cereals. Pita bread. Low-fat, low-sodium crackers. Whole-wheat flour tortillas. Vegetables Fresh or frozen vegetables (raw, steamed, roasted, or grilled). Low-sodium or reduced-sodium tomato and vegetable juice. Low-sodium or reduced-sodium tomato sauce and tomato paste. Low-sodium or reduced-sodium canned vegetables. Fruits All fresh, dried, or frozen fruit. Canned fruit in  natural juice (without added sugar). Meat and other protein foods Skinless chicken or [place]. Ground chicken or [place]. Pork with fat trimmed off. Fish and seafood. Egg whites. Dried beans, peas, or lentils. Unsalted nuts, nut butters, and seeds. Unsalted canned beans. Lean cuts of beef with fat trimmed off. Low-sodium, lean deli meat. Dairy Low-fat (1%) or fat-free (skim) milk. Fat-free, low-fat, or reduced-fat cheeses. Nonfat, low-sodium ricotta or cottage cheese. Low-fat or nonfat yogurt. Low-fat, low-sodium cheese. Fats and oils Soft margarine without trans fats. Vegetable oil. Low-fat, reduced-fat, or light mayonnaise and salad dressings (reduced-sodium). Canola, safflower, olive, soybean, and sunflower oils. Avocado. Seasoning and other foods Herbs. Spices. Seasoning mixes without salt. Unsalted popcorn and pretzels. Fat-free sweets. What foods are not recommended? The items listed may not be a complete list. Talk with your dietitian about what dietary choices are best for you. Grains Baked goods made with fat, such as croissants, muffins, or some breads. Dry pasta or rice meal packs. Vegetables Creamed or fried vegetables. Vegetables in a cheese sauce. Regular canned vegetables (not low-sodium or reduced-sodium). Regular canned tomato sauce and paste (not low-sodium or reduced-sodium). Regular tomato and vegetable juice (not low-sodium or reduced-sodium). Angie Fava. Olives. Fruits Canned fruit in a light or heavy syrup. Fried fruit. Fruit in cream or butter sauce. Meat and other protein foods Fatty cuts of meat. Ribs. Fried meat. Berniece Salines. Sausage. Bologna and other processed lunch meats. Salami. Fatback. Hotdogs. Bratwurst. Salted nuts and seeds. Canned beans with added salt. Canned or smoked fish. Whole eggs or egg yolks. Chicken or [place] with skin. Dairy Whole or 2% milk, cream, and half-and-half. Whole or full-fat cream cheese. Whole-fat or sweetened yogurt. Full-fat cheese. Nondairy  creamers. Whipped toppings. Processed cheese and cheese spreads. Fats and oils Butter. Stick margarine. Lard. Shortening. Ghee. Bacon fat. Tropical oils, such as coconut, palm kernel, or palm oil. Seasoning and other foods Salted popcorn and pretzels. Onion salt, garlic salt, seasoned salt, table salt, and sea salt. Worcestershire sauce. Tartar sauce. Barbecue sauce. Teriyaki sauce. Soy sauce, including reduced-sodium. Steak sauce. Canned and packaged gravies. Fish sauce. Oyster sauce. Cocktail sauce. Horseradish that you find on the shelf. Ketchup. Mustard. Meat flavorings and tenderizers. Bouillon cubes. Hot sauce and Tabasco sauce. Premade or packaged marinades. Premade or packaged taco seasonings. Relishes. Regular salad dressings. Where to find more information:  National Heart, Lung, and [place]: https://wilson-eaton.com/  American Heart Association: www.heart.org Summary  The DASH eating plan is a healthy eating plan that has been shown to reduce high blood pressure (hypertension). It may also reduce your risk for type 2 diabetes, heart disease, and stroke.  With the DASH eating plan, you should limit salt (sodium) intake to 2,300 mg a day. If you have hypertension, you may need to reduce your sodium intake to 1,500 mg a day.  When on the DASH eating plan, aim to eat more fresh fruits and vegetables, whole grains, lean proteins, low-fat dairy, and heart-healthy fats.  Work with your health care provider or diet and nutrition specialist (dietitian) to adjust your eating  plan to your individual calorie needs. This information is not intended to replace advice given to you by your health care provider. Make sure you discuss any questions you have with your health care provider. Document Revised: 10/02/2017 Document Reviewed: 10/13/2016 Elsevier Patient Education  2020 [employer].

## 2020-10-08 NOTE — Assessment & Plan Note (Signed)
Well controlled on current meds, no changes at this time.

## 2020-10-08 NOTE — Assessment & Plan Note (Signed)
He is aware he is overdue for his repeat colonoscopy and he agrees to call and reschedule with Dr Henrene Pastor at [place]. No complaints of

## 2020-10-08 NOTE — Assessment & Plan Note (Signed)
Has improved greatly with the great care from hematology

## 2020-10-08 NOTE — Assessment & Plan Note (Signed)
Encouraged heart healthy diet, increase exercise, avoid trans fats, consider a krill oil cap daily. He has made lifestyle adjustments so will check labs today

## 2020-10-08 NOTE — Assessment & Plan Note (Addendum)
He has felt better on the Amlodipine 2.5 mg daily but still notes 150-170 over 90-100 at home and at other medical offices. Will increase his Amlodipine to 5 mg daily. Monitor resting BP at home and report to [place] so we can consider further adjustments if needed.

## 2020-10-08 NOTE — Progress Notes (Signed)
Subjective:    Patient ID: Timothy Hoffman, male    DOB: Aug 20, 1961, 59 y.o.   MRN: [id number]  Chief Complaint  Patient presents with  . Follow-up    HPI Patient is in today for follow up on chronic medical concerns.  No recent febrile illness or hospitalizations.  He did undergo hemorrhoidectomy with Dr. Johney Maine of general surgery and while recovery was slow he has recovered well and has no complaints in this regard.  Continues to follow with hematology regarding his anemia and notes his energy is much better since his anemia has improved.  He denies any acute concerns.  Reports he feels better since starting amlodipine although he does acknowledge his blood pressures have been somewhat elevated still at home he notes 150/90 and at hematology he saw 170/100 still.  No headaches or chest pains.  No other acute concerns.  He does note his anxiety is improved on low-dose sertraline and he feels well overall. He is exercising better and has cleaned up his diet notably. Denies CP/palp/SOB/HA/congestion/fevers/GI or GU c/o. Taking meds as prescribed  Past Medical History:  Diagnosis Date  . Allergy    SEASONAL  . Anemia    IV iron x3 05/2019  . Anxiety   . Arrhythmia 1999  . Atopic dermatitis   . Diverticulosis   . History of colon polyps   . Hyperlipemia   . Internal hemorrhoid, bleeding 2013  . PONV (postoperative nausea and vomiting)     Past Surgical History:  Procedure Laterality Date  . COLONOSCOPY    . EVALUATION UNDER ANESTHESIA WITH HEMORRHOIDECTOMY N/A 07/07/2019   Procedure: HEMORRHOIDECTOMY, HEMORRHOIDAL LIGATION/PEXY ANORECTAL EXAM UNDER ANESTHESIA;  Surgeon: Michael Boston, MD;  Location: [facility];  Service: General;  Laterality: N/A;  LOCAL  . KNEE ARTHROSCOPY Right 2012   Meniscal repair  . Right shoulder scope    . WISDOM TOOTH EXTRACTION      Family History  Problem Relation Age of Onset  . Lymphoma Father   . Cancer Father        nonHodgkin's lymphoma  .  Hyperlipidemia Mother   . Hypertension Mother   . Hypertension Brother   . Hyperlipidemia Brother   . Mental illness Son        Anxiety  . Anxiety disorder Son   . Stroke Maternal Grandmother   . Colon cancer Neg Hx     Social History   Socioeconomic History  . Marital status: Married    Spouse name: Erline Levine  . Number of children: 2  . Years of education: college  . Highest education level: Not on file  Occupational History  . Occupation: Automotive engineer: [employer]  Tobacco Use  . Smoking status: Never Smoker  . Smokeless tobacco: Never Used  . Tobacco comment: 20 yrs ago   Vaping Use  . Vaping Use: Never used  Substance and Sexual Activity  . Alcohol use: Yes    Alcohol/week: 2.0 standard drinks    Types: 2 Cans of beer per week  . Drug use: No  . Sexual activity: Yes    Partners: Female  Other Topics Concern  . Not on file  Social History Narrative   Lives with his wife and their 2 children.     His wife is an Therapist, sports.     Their son has significant anxiety.   He exercises by running 3-5 miles 4x/week.      Vegan   Social Determinants of Health  Financial Resource Strain:   . Difficulty of Paying Living Expenses: Not on file  Food Insecurity:   . Worried About Charity fundraiser in the Last Year: Not on file  . Ran Out of Food in the Last Year: Not on file  Transportation Needs:   . Lack of Transportation (Medical): Not on file  . Lack of Transportation (Non-Medical): Not on file  Physical Activity:   . Days of Exercise per Week: Not on file  . Minutes of Exercise per Session: Not on file  Stress:   . Feeling of Stress : Not on file  Social Connections:   . Frequency of Communication with Friends and Family: Not on file  . Frequency of Social Gatherings with Friends and Family: Not on file  . Attends Religious Services: Not on file  . Active Member of Clubs or Organizations: Not on file  . Attends Archivist Meetings: Not on file   . Marital Status: Not on file  Intimate Partner Violence:   . Fear of Current or Ex-Partner: Not on file  . Emotionally Abused: Not on file  . Physically Abused: Not on file  . Sexually Abused: Not on file    Outpatient Medications Prior to Visit  Medication Sig Dispense Refill  . ezetimibe (ZETIA) 10 MG tablet TAKE 1 TABLET (10 MG TOTAL) BY MOUTH DAILY. 90 tablet 0  . hydrocortisone cream 1 % Apply 1 application topically daily as needed (poison ivy).    Marland Kitchen ibuprofen (ADVIL) 200 MG tablet Take 600 mg by mouth every 6 (six) hours as needed for headache or moderate pain.    Marland Kitchen loratadine (CLARITIN) 10 MG tablet Take 10 mg by mouth daily as needed for allergies.    . Omega-3 Fatty Acids (FISH OIL PO) Take 1,280 mg by mouth 2 (two) times a week.    . rosuvastatin (CRESTOR) 40 MG tablet TAKE 1 TABLET (40 MG TOTAL) BY MOUTH DAILY. 90 tablet 0  . sertraline (ZOLOFT) 50 MG tablet Take 1 tablet (50 mg total) by mouth daily. 90 tablet 1  . sildenafil (REVATIO) 20 MG tablet Take 3 tablets by mouth as needed.    . triamcinolone cream (KENALOG) 0.1 % Apply topically daily. (Patient taking differently: Apply 1 application topically daily as needed (atopic dermatitis). ) 80 g 1  . amLODipine (NORVASC) 2.5 MG tablet Take 1 tablet (2.5 mg total) by mouth daily. 30 tablet 1  . Ferrous Fumarate (IRON) 18 MG TBCR Take 18 mg by mouth daily.    . folic acid (FOLVITE) 1 MG tablet Take 1 tablet (1 mg total) by mouth daily. 30 tablet 6   No facility-administered medications prior to visit.    Allergies  Allergen Reactions  . Rose Hips [Ascorbate] Itching and Rash  . Tequin Rash    Review of Systems  Constitutional: Negative for fever and malaise/fatigue.  HENT: Negative for congestion.   Eyes: Negative for blurred vision.  Respiratory: Negative for shortness of breath.   Cardiovascular: Negative for chest pain, palpitations and leg swelling.  Gastrointestinal: Negative for abdominal pain, blood in  stool and nausea.  Genitourinary: Negative for dysuria and frequency.  Musculoskeletal: Negative for falls.  Skin: Negative for rash.  Neurological: Negative for dizziness, loss of consciousness and headaches.  Endo/Heme/Allergies: Negative for environmental allergies.  Psychiatric/Behavioral: Negative for depression. The patient is not nervous/anxious.        Objective:    Physical Exam Vitals and nursing note reviewed.  Constitutional:  General: He is not in acute distress.    Appearance: He is well-developed.  HENT:     Head: Normocephalic and atraumatic.     Nose: Nose normal.  Eyes:     General:        Right eye: No discharge.        Left eye: No discharge.  Cardiovascular:     Rate and Rhythm: Normal rate and regular rhythm.     Heart sounds: No murmur heard.   Pulmonary:     Effort: Pulmonary effort is normal.     Breath sounds: Normal breath sounds.  Abdominal:     General: Bowel sounds are normal.     Palpations: Abdomen is soft.     Tenderness: There is no abdominal tenderness.  Musculoskeletal:     Cervical back: Normal range of motion and neck supple.  Skin:    General: Skin is warm and dry.  Neurological:     Mental Status: He is alert and oriented to person, place, and time.     BP (!) 130/98 (BP Location: Right Arm, Patient Position: Sitting, Cuff Size: Large) Comment: 1st 160/100 1st check  Pulse 79   Temp 98.2 F (36.8 C) (Temporal)   Resp 15   Wt 196 lb (88.9 kg)   SpO2 98%   BMI 28.12 kg/m  Wt Readings from Last 3 Encounters:  10/08/20 196 lb (88.9 kg)  05/10/20 189 lb (85.7 kg)  12/12/19 189 lb 1.9 oz (85.8 kg)    Diabetic Foot Exam - Simple   No data filed     Lab Results  Component Value Date   WBC 4.4 10/02/2020   HGB 15.4 10/02/2020   HCT 46.4 10/02/2020   PLT 250 10/02/2020   GLUCOSE 98 05/10/2020   CHOL 212 (H) 05/10/2020   TRIG 256.0 (H) 05/10/2020   HDL 58.00 05/10/2020   LDLDIRECT 114.0 05/10/2020   LDLCALC 85  03/15/2019   ALT 41 05/10/2020   AST 40 (H) 05/10/2020   NA 140 05/10/2020   K 4.5 05/10/2020   CL 103 05/10/2020   CREATININE 0.84 05/10/2020   BUN 21 05/10/2020   CO2 27 05/10/2020   TSH 1.93 05/10/2020   PSA 2.24 09/06/2018    Lab Results  Component Value Date   TSH 1.93 05/10/2020   Lab Results  Component Value Date   WBC 4.4 10/02/2020   HGB 15.4 10/02/2020   HCT 46.4 10/02/2020   MCV 94.1 10/02/2020   PLT 250 10/02/2020   Lab Results  Component Value Date   NA 140 05/10/2020   K 4.5 05/10/2020   CO2 27 05/10/2020   GLUCOSE 98 05/10/2020   BUN 21 05/10/2020   CREATININE 0.84 05/10/2020   BILITOT 0.9 05/10/2020   ALKPHOS 63 05/10/2020   AST 40 (H) 05/10/2020   ALT 41 05/10/2020   PROT 7.2 05/10/2020   ALBUMIN 4.5 05/10/2020   CALCIUM 9.8 05/10/2020   ANIONGAP 9 04/04/2019   GFR 93.04 05/10/2020   Lab Results  Component Value Date   CHOL 212 (H) 05/10/2020   Lab Results  Component Value Date   HDL 58.00 05/10/2020   Lab Results  Component Value Date   LDLCALC 85 03/15/2019   Lab Results  Component Value Date   TRIG 256.0 (H) 05/10/2020   Lab Results  Component Value Date   CHOLHDL 4 05/10/2020   No results found for: HGBA1C     Assessment & Plan:   Problem List  Items Addressed This Visit    Generalized anxiety disorder (Chronic)    Well controlled on current meds, no changes at this time.       Lipid disorder    Encouraged heart healthy diet, increase exercise, avoid trans fats, consider a krill oil cap daily. He has made lifestyle adjustments so will check labs today      Hypertension    He has felt better on the Amlodipine 2.5 mg daily but still notes 150-170 over 90-100 at home and at other medical offices. Will increase his Amlodipine to 5 mg daily. Monitor resting BP at home and report to [place] so we can consider further adjustments if needed.       Relevant Medications   amLODipine (NORVASC) 5 MG tablet   History of colon polyps     He is aware he is overdue for his repeat colonoscopy and he agrees to call and reschedule with Dr Henrene Pastor at [place]. No complaints of      IDA (iron deficiency anemia)    Has improved greatly with the great care from hematology         I have discontinued Jonathon H. Fana's amLODipine. I am also having him start on amLODipine. Additionally, I am having him maintain his triamcinolone, folic acid, Iron, Omega-3 Fatty Acids (FISH OIL PO), ibuprofen, loratadine, hydrocortisone cream, sildenafil, sertraline, rosuvastatin, and ezetimibe.  Meds ordered this encounter  Medications  . amLODipine (NORVASC) 5 MG tablet    Sig: Take 1 tablet (5 mg total) by mouth daily.    Dispense:  90 tablet    Refill:  1     Penni Homans, MD

## 2020-10-12 MED FILL — SERTRALINE HCL 50 MG TABLET: 50 | 90 days supply | Qty: 90 | Fill #1

## 2020-10-25 ENCOUNTER — Encounter: Payer: Self-pay | Admitting: Internal Medicine

## 2020-12-05 ENCOUNTER — Telehealth: Payer: Self-pay | Admitting: *Deleted

## 2020-12-05 NOTE — Telephone Encounter (Signed)
Patient no show for pre visit.  LVM requesting patient to return call to reschedule to avoid cancellation of up-coming colonoscopy.

## 2020-12-05 NOTE — Telephone Encounter (Signed)
No return phone call.  Colonoscopy cancelled and missed appointment letter sent through [place] and mailed a copy.

## 2020-12-18 ENCOUNTER — Encounter: Payer: No Typology Code available for payment source | Admitting: Internal Medicine

## 2020-12-19 ENCOUNTER — Other Ambulatory Visit (HOSPITAL_COMMUNITY): Payer: Self-pay

## 2020-12-19 MED FILL — PREVIDENT 0.2% RINSE: 0.2 | 30 days supply | Qty: 473 | Fill #0

## 2020-12-19 MED FILL — CHLORHEXIDINE 0.12% RINSE: 0.12 | 16 days supply | Qty: 473 | Fill #0

## 2020-12-20 ENCOUNTER — Other Ambulatory Visit (HOSPITAL_COMMUNITY): Payer: Self-pay | Admitting: Endodontics

## 2020-12-20 MED FILL — AMOXICILLIN 500 MG CAPSULE: 500 | 6 days supply | Qty: 21 | Fill #0

## 2020-12-21 ENCOUNTER — Other Ambulatory Visit: Payer: Self-pay | Admitting: Family Medicine

## 2020-12-21 ENCOUNTER — Encounter: Payer: Self-pay | Admitting: Family Medicine

## 2020-12-21 DIAGNOSIS — E789 Disorder of lipoprotein metabolism, unspecified: Secondary | ICD-10-CM

## 2020-12-21 MED FILL — ROSUVASTATIN CALCIUM 40 MG: 40 | 90 days supply | Qty: 90 | Fill #0

## 2020-12-21 MED FILL — EZETIMIBE 10 MG TABS: 10 | 90 days supply | Qty: 90 | Fill #0

## 2021-01-08 ENCOUNTER — Other Ambulatory Visit: Payer: Self-pay | Admitting: Family Medicine

## 2021-01-08 MED FILL — SERTRALINE HCL 50 MG TABLET: 50 | 90 days supply | Qty: 90 | Fill #0

## 2021-01-23 ENCOUNTER — Other Ambulatory Visit: Payer: Self-pay | Admitting: Internal Medicine

## 2021-01-23 ENCOUNTER — Other Ambulatory Visit: Payer: Self-pay

## 2021-01-23 ENCOUNTER — Ambulatory Visit (AMBULATORY_SURGERY_CENTER): Payer: Self-pay | Admitting: *Deleted

## 2021-01-23 VITALS — Ht 70.0 in | Wt 191.8 lb

## 2021-01-23 DIAGNOSIS — Z860100 Personal history of colon polyps, unspecified: Secondary | ICD-10-CM

## 2021-01-23 MED ORDER — SUPREP BOWEL PREP KIT 17.5-3.13-1.6 GM/177ML PO SOLN: 1.0000 | Freq: Once | ORAL | 0 refills | Status: DC

## 2021-01-23 MED FILL — SUPREP BOWEL PREP KIT: 17.5-3.13-1 | 1 days supply | Qty: 354 | Fill #0

## 2021-01-23 NOTE — Progress Notes (Signed)
No egg or soy allergy known to patient  No issues with past sedation with any surgeries or procedures Patient denies ever being told they had issues or difficulty with intubation  No FH of Malignant Hyperthermia No diet pills per patient No home 02 use per patient  No blood thinners per patient  Pt denies issues with constipation  No A fib or A flutter  EMMI video to pt or via [place] 19 guidelines implemented in [place] today with Pt and RN  Pt is fully vaccinated  for [employer] given to pt in [facility] today , Code to Pharmacy and  NO PA's for preps discussed with pt In [facility] today  Discussed with pt there will be an out-of-pocket cost for prep and that varies from $0 to 70 dollars   Due to the COVID-19 pandemic we are asking patients to follow certain guidelines.  Pt aware of COVID protocols and [facility] guidelines

## 2021-01-29 MED FILL — AMLODIPINE BESYLATE 5 MG TA: 5 | 90 days supply | Qty: 90 | Fill #1

## 2021-02-06 ENCOUNTER — Ambulatory Visit (AMBULATORY_SURGERY_CENTER): Payer: No Typology Code available for payment source | Admitting: Internal Medicine

## 2021-02-06 ENCOUNTER — Encounter: Payer: Self-pay | Admitting: Internal Medicine

## 2021-02-06 ENCOUNTER — Other Ambulatory Visit: Payer: Self-pay

## 2021-02-06 VITALS — BP 141/98 | HR 79 | Temp 97.8°F | Resp 14 | Ht 70.0 in | Wt 191.8 lb

## 2021-02-06 DIAGNOSIS — D125 Benign neoplasm of sigmoid colon: Secondary | ICD-10-CM | POA: Diagnosis not present

## 2021-02-06 DIAGNOSIS — Z86010 Personal history of colonic polyps: Secondary | ICD-10-CM | POA: Diagnosis not present

## 2021-02-06 DIAGNOSIS — D124 Benign neoplasm of descending colon: Secondary | ICD-10-CM

## 2021-02-06 DIAGNOSIS — D122 Benign neoplasm of ascending colon: Secondary | ICD-10-CM

## 2021-02-06 MED ORDER — SODIUM CHLORIDE 0.9 % IV SOLN
500.0000 mL | Freq: Once | INTRAVENOUS | Status: DC
Start: 2021-02-06 — End: 2021-02-06

## 2021-02-06 NOTE — Op Note (Signed)
Timothy Hoffman Patient Name: [name] Procedure Date: 02/06/2021 8:20 AM MRN: [id number] Endoscopist: Docia Chuck. Henrene Pastor , MD Age: 60 Referring MD:  Date of Birth: 01-05-1961 Gender: Male Account #: [id number] Procedure:                Colonoscopy with cold snare polypectomy x 3 Indications:              High risk colon cancer surveillance: Personal                            history of multiple (3 or more) adenomas. Previous                            examinations 2012, 2018 Medicines:                Monitored Anesthesia Care Procedure:                Pre-Anesthesia Assessment:                           - Prior to the procedure, a History and Physical                            was performed, and patient medications and                            allergies were reviewed. The patient's tolerance of                            previous anesthesia was also reviewed. The risks                            and benefits of the procedure and the sedation                            options and risks were discussed with the patient.                            All questions were answered, and informed consent                            was obtained. Prior Anticoagulants: The patient has                            taken no previous anticoagulant or antiplatelet                            agents. ASA Grade Assessment: II - A patient with                            mild systemic disease. After reviewing the risks                            and benefits, the patient was deemed in  satisfactory condition to undergo the procedure.                           After obtaining informed consent, the colonoscope                            was passed under direct vision. Throughout the                            procedure, the patient's blood pressure, pulse, and                            oxygen saturations were monitored continuously. The                            Olympus  CF-HQ190L ([id number]) Colonoscope was                            introduced through the anus and advanced to the the                            cecum, identified by appendiceal orifice and                            ileocecal valve. The ileocecal valve, appendiceal                            orifice, and rectum were photographed. The quality                            of the bowel preparation was excellent. The                            colonoscopy was performed without difficulty. The                            patient tolerated the procedure well. The bowel                            preparation used was SUPREP via split dose                            instruction. Scope In: 8:34:28 AM Scope Out: 8:50:36 AM Scope Withdrawal Time: 0 hours 14 minutes 25 seconds  Total Procedure Duration: 0 hours 16 minutes 8 seconds  Findings:                 Three polyps were found in the sigmoid colon,                            descending colon and ascending colon. The polyps                            were 2 to 4 mm in size. These polyps were removed  with a cold snare. Resection and retrieval were                            complete.                           A few diverticula were found in the sigmoid colon.                           The exam was otherwise without abnormality on                            direct and retroflexion views. Complications:            No immediate complications. Estimated blood loss:                            None. Estimated Blood Loss:     Estimated blood loss: none. Impression:               - Three 2 to 4 mm polyps in the sigmoid colon, in                            the descending colon and in the ascending colon,                            removed with a cold snare. Resected and retrieved.                           - Diverticulosis in the sigmoid colon.                           - The examination was otherwise normal on direct                             and retroflexion views. Recommendation:           - Repeat colonoscopy in 3 - 5 years for                            surveillance.                           - Patient has a contact number available for                            emergencies. The signs and symptoms of potential                            delayed complications were discussed with the                            patient. Return to normal activities tomorrow.                            Written discharge instructions were provided  to the                            patient.                           - Resume previous diet.                           - Continue present medications.                           - Await pathology results. Docia Chuck. Henrene Pastor, MD 02/06/2021 9:20:02 AM This report has been signed electronically.

## 2021-02-06 NOTE — Progress Notes (Signed)
Called to room to assist during endoscopic procedure.  Patient ID and intended procedure confirmed with present staff. Received instructions for my participation in the procedure from the performing physician.  

## 2021-02-06 NOTE — Patient Instructions (Signed)
Please read handouts provided. Continue present medications. Await pathology results.   YOU HAD AN ENDOSCOPIC PROCEDURE TODAY AT THE [facility]:   Refer to the procedure report that was given to you for any specific questions about what was found during the examination.  If the procedure report does not answer your questions, please call your gastroenterologist to clarify.  If you requested that your care partner not be given the details of your procedure findings, then the procedure report has been included in a sealed envelope for you to review at your convenience later.  YOU SHOULD EXPECT: Some feelings of bloating in the abdomen. Passage of more gas than usual.  Walking can help get rid of the air that was put into your GI tract during the procedure and reduce the bloating. If you had a lower endoscopy (such as a colonoscopy or flexible sigmoidoscopy) you may notice spotting of blood in your stool or on the toilet paper. If you underwent a bowel prep for your procedure, you may not have a normal bowel movement for a few days.  Please Note:  You might notice some irritation and congestion in your nose or some drainage.  This is from the oxygen used during your procedure.  There is no need for concern and it should clear up in a day or so.  SYMPTOMS TO REPORT IMMEDIATELY:  Following lower endoscopy (colonoscopy or flexible sigmoidoscopy):  Excessive amounts of blood in the stool  Significant tenderness or worsening of abdominal pains  Swelling of the abdomen that is new, acute  Fever of 100F or higher   For urgent or emergent issues, a gastroenterologist can be reached at any hour by calling [phone]. Do not use MyChart messaging for urgent concerns.    DIET:  We do recommend a small meal at first, but then you may proceed to your regular diet.  Drink plenty of fluids but you should avoid alcoholic beverages for 24 hours.  ACTIVITY:  You should plan to take it easy  for the rest of today and you should NOT DRIVE or use heavy machinery until tomorrow (because of the sedation medicines used during the test).    FOLLOW UP: Our staff will call the number listed on your records 48-72 hours following your procedure to check on you and address any questions or concerns that you may have regarding the information given to you following your procedure. If we do not reach you, we will leave a message.  We will attempt to reach you two times.  During this call, we will ask if you have developed any symptoms of COVID 19. If you develop any symptoms (ie: fever, flu-like symptoms, shortness of breath, cough etc.) before then, please call [phone].  If you test positive for Covid 19 in the 2 weeks post procedure, please call and report this information to us.    If any biopsies were taken you will be contacted by phone or by letter within the next 1-3 weeks.  Please call us at [phone] if you have not heard about the biopsies in 3 weeks.    SIGNATURES/CONFIDENTIALITY: You and/or your care partner have signed paperwork which will be entered into your electronic medical record.  These signatures attest to the fact that that the information above on your After Visit Summary has been reviewed and is understood.  Full responsibility of the confidentiality of this discharge information lies with you and/or your care-partner.  

## 2021-02-06 NOTE — Progress Notes (Signed)
Pt's states no medical or surgical changes since previsit or office visit. 

## 2021-02-06 NOTE — Progress Notes (Signed)
pt tolerated well. VSS. awake and to recovery. Report given to RN.  

## 2021-02-08 ENCOUNTER — Telehealth: Payer: Self-pay

## 2021-02-08 NOTE — Telephone Encounter (Signed)
  Follow up Call-  Call back number 02/06/2021  Post procedure Call Back phone  # [phone]  Permission to leave phone message Yes  Some recent data might be hidden     Patient questions:  Do you have a fever, pain , or abdominal swelling? No. Pain Score  0 *  Have you tolerated food without any problems? Yes.    Have you been able to return to your normal activities? Yes.    Do you have any questions about your discharge instructions: Diet   No. Medications  No. Follow up visit  No.  Do you have questions or concerns about your Care? No.  Actions: * If pain score is 4 or above: No action needed, pain <4. 1. Have you developed a fever since your procedure? no  2.   Have you had an respiratory symptoms (SOB or cough) since your procedure? no  3.   Have you tested positive for COVID 19 since your procedure no  4.   Have you had any family members/close contacts diagnosed with the COVID 19 since your procedure?  no   If yes to any of these questions please route to Joylene John, RN and Joella Prince, RN

## 2021-02-11 ENCOUNTER — Encounter: Payer: Self-pay | Admitting: Internal Medicine

## 2021-03-18 ENCOUNTER — Other Ambulatory Visit (HOSPITAL_COMMUNITY): Payer: Self-pay

## 2021-03-18 MED ORDER — AMOXICILLIN-POT CLAVULANATE 875-125 MG PO TABS
1.0000 | ORAL_TABLET | Freq: Two times a day (BID) | ORAL | 1 refills | Status: DC
  Filled 2021-03-18: qty 20, 10d supply, fill #0

## 2021-03-21 ENCOUNTER — Ambulatory Visit: Payer: No Typology Code available for payment source | Admitting: Family Medicine

## 2021-03-26 ENCOUNTER — Other Ambulatory Visit (HOSPITAL_COMMUNITY): Payer: Self-pay

## 2021-03-26 ENCOUNTER — Encounter: Payer: Self-pay | Admitting: Family Medicine

## 2021-03-26 DIAGNOSIS — E789 Disorder of lipoprotein metabolism, unspecified: Secondary | ICD-10-CM

## 2021-03-26 MED ORDER — EZETIMIBE 10 MG PO TABS
10.0000 mg | ORAL_TABLET | Freq: Every day | ORAL | 0 refills | Status: DC
  Filled 2021-03-26: qty 90, 90d supply, fill #0

## 2021-03-26 MED ORDER — ROSUVASTATIN CALCIUM 40 MG PO TABS
40.0000 mg | ORAL_TABLET | Freq: Every day | ORAL | 0 refills | Status: DC
  Filled 2021-03-26: qty 90, 90d supply, fill #0

## 2021-04-04 ENCOUNTER — Other Ambulatory Visit (HOSPITAL_COMMUNITY): Payer: Self-pay

## 2021-04-04 MED ORDER — AMOXICILLIN 500 MG PO CAPS
500.0000 mg | ORAL_CAPSULE | Freq: Three times a day (TID) | ORAL | 0 refills | Status: DC
  Filled 2021-04-04: qty 21, 7d supply, fill #0

## 2021-04-10 ENCOUNTER — Other Ambulatory Visit (HOSPITAL_COMMUNITY): Payer: Self-pay

## 2021-04-10 MED ORDER — CARESTART COVID-19 HOME TEST VI KIT
PACK | 0 refills | Status: DC
  Filled 2021-04-10: qty 4, 4d supply, fill #0

## 2021-04-10 MED FILL — Sertraline HCl Tab 50 MG: ORAL | 90 days supply | Qty: 90 | Fill #0 | Status: AC

## 2021-04-11 ENCOUNTER — Other Ambulatory Visit (HOSPITAL_COMMUNITY): Payer: Self-pay

## 2021-04-17 ENCOUNTER — Encounter: Payer: Self-pay | Admitting: Family

## 2021-04-18 ENCOUNTER — Ambulatory Visit: Payer: No Typology Code available for payment source

## 2021-04-18 NOTE — Progress Notes (Signed)
   Covid-19 Vaccination Clinic  Name:  JIDENNA FIGGS    MRN: [id number] DOB: [birth year]  04/18/2021  Mr. Nudo was observed post Covid-19 immunization for 15 minutes without incident. He was provided with Vaccine Information Sheet and instruction to access the V-Safe system.   Mr. Fronczak was instructed to call 911 with any severe reactions post vaccine: Difficulty breathing  Swelling of face and throat  A fast heartbeat  A bad rash all over body  Dizziness and weakness

## 2021-04-19 ENCOUNTER — Other Ambulatory Visit: Payer: Self-pay | Admitting: Family Medicine

## 2021-04-19 ENCOUNTER — Encounter: Payer: Self-pay | Admitting: Family Medicine

## 2021-04-19 ENCOUNTER — Other Ambulatory Visit (HOSPITAL_BASED_OUTPATIENT_CLINIC_OR_DEPARTMENT_OTHER): Payer: Self-pay

## 2021-04-19 ENCOUNTER — Other Ambulatory Visit (HOSPITAL_COMMUNITY): Payer: Self-pay

## 2021-04-19 MED ORDER — COVID-19 MRNA VAC-TRIS(PFIZER) 30 MCG/0.3ML IM SUSP
INTRAMUSCULAR | 0 refills | Status: DC
  Filled 2021-04-19: qty 0.3, 1d supply, fill #0

## 2021-04-19 MED ORDER — AMLODIPINE BESYLATE 5 MG PO TABS
5.0000 mg | ORAL_TABLET | Freq: Every day | ORAL | 1 refills | Status: DC
  Filled 2021-04-19: qty 90, 90d supply, fill #0
  Filled 2021-08-07: qty 90, 90d supply, fill #1

## 2021-05-26 ENCOUNTER — Encounter: Payer: Self-pay | Admitting: Family Medicine

## 2021-05-27 ENCOUNTER — Encounter: Payer: Self-pay | Admitting: Family

## 2021-05-27 ENCOUNTER — Telehealth (INDEPENDENT_AMBULATORY_CARE_PROVIDER_SITE_OTHER): Payer: No Typology Code available for payment source | Admitting: Family Medicine

## 2021-05-27 ENCOUNTER — Other Ambulatory Visit (HOSPITAL_COMMUNITY): Payer: Self-pay

## 2021-05-27 ENCOUNTER — Telehealth: Payer: Self-pay

## 2021-05-27 DIAGNOSIS — U071 COVID-19: Secondary | ICD-10-CM | POA: Diagnosis not present

## 2021-05-27 MED ORDER — MOLNUPIRAVIR EUA 200MG CAPSULE
4.0000 | ORAL_CAPSULE | Freq: Two times a day (BID) | ORAL | 0 refills | Status: AC
  Filled 2021-05-27: qty 40, 5d supply, fill #0

## 2021-05-27 NOTE — Assessment & Plan Note (Signed)
Symptoms began two days ago and he tested positive that day, he notes head congestion, clear rhinorrhea and some fatigue. Due to age and htn will start Sharpes and he will notify us of any concerns. Encouraged increased rest and hydration. Treat fevers as needed. Deep breathing Pulse oximeter, want oxygen in 90s  Weekly vitals  Take Multivitamin with minerals, selenium Vitamin D 1000-2000 IU daily Probiotic with lactobacillus and bifidophilus Asprin EC 81 mg daily Discuss plan of care and risk and benefit of medication for 15 minutes

## 2021-05-27 NOTE — Progress Notes (Signed)
MyChart Video Visit    Virtual Visit via Video Note   This visit type was conducted due to national recommendations for restrictions regarding the COVID-19 Pandemic (e.g. social distancing) in an effort to limit this patient's exposure and mitigate transmission in our community. This patient is at least at moderate risk for complications without adequate follow up. This format is felt to be most appropriate for this patient at this time. Physical exam was limited by quality of the video and audio technology used for the visit. S Chism, CMA was able to get the patient set up on a video visit.  Patient location: Home Patient and provider in visit Provider location: Office  I discussed the limitations of evaluation and management by telemedicine and the availability of in person appointments. The patient expressed understanding and agreed to proceed.  Visit Date: 05/27/2021  Today's healthcare provider: Danise Edge, MD      Subjective:    Patient ID: Timothy Hoffman, male    DOB: 03/12/1961, 60 y.o.   MRN: [id number]  No chief complaint on file.   HPI Patient is in today for a video visit to discuss COVID diagnosis. He started with symptoms on Saturday, 2 days ago and already feels he may feel slightly better. Only endorses fatigue, nasal congestion and rhinorrhea. Denies CP/palp/SOB/HA/fevers/GI or GU c/o. Taking meds as prescribed   Past Medical History:  Diagnosis Date   Allergy    SEASONAL   Anemia    IV iron x3 05/2019   Anxiety    Arrhythmia 1999   Atopic dermatitis    Diverticulosis    History of colon polyps    Hyperlipemia    Internal hemorrhoid, bleeding 2013   PONV (postoperative nausea and vomiting)     Past Surgical History:  Procedure Laterality Date   COLONOSCOPY     EVALUATION UNDER ANESTHESIA WITH HEMORRHOIDECTOMY N/A 07/07/2019   Procedure: HEMORRHOIDECTOMY, HEMORRHOIDAL LIGATION/PEXY ANORECTAL EXAM UNDER ANESTHESIA;  Surgeon: Karie Soda, MD;   Location: [facility];  Service: General;  Laterality: N/A;  LOCAL   KNEE ARTHROSCOPY Right 2012   Meniscal repair   POLYPECTOMY     Right shoulder scope     WISDOM TOOTH EXTRACTION      Family History  Problem Relation Age of Onset   Lymphoma Father    Cancer Father        nonHodgkin's lymphoma   Hyperlipidemia Mother    Hypertension Mother    Hypertension Brother    Hyperlipidemia Brother    Mental illness Son        Anxiety   Anxiety disorder Son    Stroke Maternal Grandmother    Colon cancer Neg Hx    Colon polyps Neg Hx    Esophageal cancer Neg Hx    Rectal cancer Neg Hx    Stomach cancer Neg Hx     Social History   Socioeconomic History   Marital status: Married    Spouse name: Misty Stanley   Number of children: 2   Years of education: college   Highest education level: Not on file  Occupational History   Occupation: Audiological scientist: [employer]  Tobacco Use   Smoking status: Never   Smokeless tobacco: Never   Tobacco comments:    20 yrs ago   Vaping Use   Vaping Use: Never used  Substance and Sexual Activity   Alcohol use: Yes    Alcohol/week: 2.0 standard drinks    Types:  2 Cans of beer per week   Drug use: No   Sexual activity: Yes    Partners: Female  Other Topics Concern   Not on file  Social History Narrative   Lives with his wife and their 2 children.     His wife is an Therapist, sports.     Their son has significant anxiety.   He exercises by running 3-5 miles 4x/week.      Vegan   Social Determinants of Health   Financial Resource Strain: Not on file  Food Insecurity: Not on file  Transportation Needs: Not on file  Physical Activity: Not on file  Stress: Not on file  Social Connections: Not on file  Intimate Partner Violence: Not on file    Outpatient Medications Prior to Visit  Medication Sig Dispense Refill   amLODipine (NORVASC) 5 MG tablet Take 1 tablet (5 mg total) by mouth daily. 90 tablet 1   ezetimibe (ZETIA) 10 MG tablet Take 1  tablet (10 mg total) by mouth daily. 90 tablet 0   hydrocortisone cream 1 % Apply 1 application topically daily as needed (poison ivy).     ibuprofen (ADVIL) 200 MG tablet Take 600 mg by mouth every 6 (six) hours as needed for headache or moderate pain.     loratadine (CLARITIN) 10 MG tablet Take 10 mg by mouth daily as needed for allergies.     PREVIDENT 0.2 % SOLN Take by mouth as directed.     rosuvastatin (CRESTOR) 40 MG tablet Take 1 tablet (40 mg total) by mouth daily. 90 tablet 0   sertraline (ZOLOFT) 50 MG tablet Take 1 tablet (50 mg total) by mouth daily. 90 tablet 1   sildenafil (REVATIO) 20 MG tablet Take 3 tablets by mouth as needed.     triamcinolone cream (KENALOG) 0.1 % Apply topically daily. (Patient taking differently: Apply 1 application topically daily as needed (atopic dermatitis).) 80 g 1   amoxicillin (AMOXIL) 500 MG capsule Take 1 capsule (500 mg total) by mouth 3 (three) times daily until finished 21 capsule 0   amoxicillin-clavulanate (AUGMENTIN) 875-125 MG tablet Take 1 tablet by mouth 2 (two) times daily until gone. 20 tablet 1   COVID-19 At Home Antigen Test [facility] COVID-19 HOME TEST) KIT use as directed (Patient not taking: Reported on 05/27/2021) 4 each 0   COVID-19 mRNA Vac-TriS, Pfizer, SUSP injection Inject into the muscle. (Patient not taking: Reported on 05/27/2021) 0.3 mL 0   No facility-administered medications prior to visit.    Allergies  Allergen Reactions   Rose Hips [Ascorbate] Itching and Rash   Tequin Rash    Review of Systems  Constitutional:  Positive for malaise/fatigue. Negative for fever.  HENT:  Positive for congestion.   Eyes:  Negative for blurred vision.  Respiratory:  Positive for sputum production. Negative for shortness of breath.   Cardiovascular:  Negative for chest pain, palpitations and leg swelling.  Gastrointestinal:  Negative for abdominal pain, blood in stool and nausea.  Genitourinary:  Negative for dysuria and  frequency.  Musculoskeletal:  Negative for falls.  Skin:  Negative for rash.  Neurological:  Negative for dizziness, loss of consciousness and headaches.  Endo/Heme/Allergies:  Negative for environmental allergies.  Psychiatric/Behavioral:  Negative for depression. The patient is not nervous/anxious.       Objective:    Physical Exam Constitutional:      General: He is not in acute distress.    Appearance: Normal appearance. He is not ill-appearing or  toxic-appearing.  HENT:     Head: Normocephalic and atraumatic.     Right Ear: External ear normal.     Left Ear: External ear normal.     Nose: Nose normal.  Eyes:     General:        Right eye: No discharge.        Left eye: No discharge.  Pulmonary:     Effort: Pulmonary effort is normal.  Skin:    Findings: No rash.  Neurological:     Mental Status: He is alert and oriented to person, place, and time.  Psychiatric:        Behavior: Behavior normal.    Pulse 72   Wt 182 lb (82.6 kg)   BMI 26.11 kg/m  Wt Readings from Last 3 Encounters:  05/27/21 182 lb (82.6 kg)  02/06/21 191 lb 12.8 oz (87 kg)  01/23/21 191 lb 12.8 oz (87 kg)    Diabetic Foot Exam - Simple   No data filed    Lab Results  Component Value Date   WBC 4.4 10/02/2020   HGB 15.4 10/02/2020   HCT 46.4 10/02/2020   PLT 250 10/02/2020   GLUCOSE 98 05/10/2020   CHOL 212 (H) 05/10/2020   TRIG 256.0 (H) 05/10/2020   HDL 58.00 05/10/2020   LDLDIRECT 114.0 05/10/2020   LDLCALC 85 03/15/2019   ALT 41 05/10/2020   AST 40 (H) 05/10/2020   NA 140 05/10/2020   K 4.5 05/10/2020   CL 103 05/10/2020   CREATININE 0.84 05/10/2020   BUN 21 05/10/2020   CO2 27 05/10/2020   TSH 1.93 05/10/2020   PSA 2.24 09/06/2018    Lab Results  Component Value Date   TSH 1.93 05/10/2020   Lab Results  Component Value Date   WBC 4.4 10/02/2020   HGB 15.4 10/02/2020   HCT 46.4 10/02/2020   MCV 94.1 10/02/2020   PLT 250 10/02/2020   Lab Results  Component  Value Date   NA 140 05/10/2020   K 4.5 05/10/2020   CO2 27 05/10/2020   GLUCOSE 98 05/10/2020   BUN 21 05/10/2020   CREATININE 0.84 05/10/2020   BILITOT 0.9 05/10/2020   ALKPHOS 63 05/10/2020   AST 40 (H) 05/10/2020   ALT 41 05/10/2020   PROT 7.2 05/10/2020   ALBUMIN 4.5 05/10/2020   CALCIUM 9.8 05/10/2020   ANIONGAP 9 04/04/2019   GFR 93.04 05/10/2020   Lab Results  Component Value Date   CHOL 212 (H) 05/10/2020   Lab Results  Component Value Date   HDL 58.00 05/10/2020   Lab Results  Component Value Date   LDLCALC 85 03/15/2019   Lab Results  Component Value Date   TRIG 256.0 (H) 05/10/2020   Lab Results  Component Value Date   CHOLHDL 4 05/10/2020   No results found for: HGBA1C     Assessment & Plan:   Problem List Items Addressed This Visit   None     No orders of the defined types were placed in this encounter.   I discussed the assessment and treatment plan with the patient. The patient was provided an opportunity to ask questions and all were answered. The patient agreed with the plan and demonstrated an understanding of the instructions.   The patient was advised to call back or seek an in-person evaluation if the symptoms worsen or if the condition fails to improve as anticipated.  I provided 20 minutes of face-to-face time during this encounter.  Penni Homans, MD [facility] at [facility] [phone] (phone) [phone] (fax)  [place]

## 2021-05-27 NOTE — Telephone Encounter (Signed)
Had an appointment today.

## 2021-05-27 NOTE — Telephone Encounter (Signed)
Called pt to get scheduled for physical in 6 months

## 2021-05-28 ENCOUNTER — Encounter: Payer: Self-pay | Admitting: Family Medicine

## 2021-05-29 ENCOUNTER — Encounter: Payer: Self-pay | Admitting: Family

## 2021-05-29 ENCOUNTER — Telehealth: Payer: Self-pay

## 2021-05-31 ENCOUNTER — Inpatient Hospital Stay: Payer: No Typology Code available for payment source

## 2021-05-31 ENCOUNTER — Inpatient Hospital Stay: Payer: No Typology Code available for payment source | Admitting: Family

## 2021-06-11 ENCOUNTER — Inpatient Hospital Stay: Payer: No Typology Code available for payment source | Attending: Hematology & Oncology

## 2021-06-11 ENCOUNTER — Inpatient Hospital Stay: Payer: No Typology Code available for payment source | Admitting: Family

## 2021-06-11 ENCOUNTER — Encounter: Payer: Self-pay | Admitting: Family

## 2021-06-11 ENCOUNTER — Other Ambulatory Visit: Payer: Self-pay

## 2021-06-11 VITALS — BP 149/93 | HR 86 | Temp 98.9°F | Resp 18 | Ht 70.0 in | Wt 187.0 lb

## 2021-06-11 DIAGNOSIS — K649 Unspecified hemorrhoids: Secondary | ICD-10-CM | POA: Insufficient documentation

## 2021-06-11 DIAGNOSIS — D500 Iron deficiency anemia secondary to blood loss (chronic): Secondary | ICD-10-CM

## 2021-06-11 LAB — CBC WITH DIFFERENTIAL (CANCER CENTER ONLY)
Abs Immature Granulocytes: 0.02 K/uL (ref 0.00–0.07)
Basophils Absolute: 0.1 K/uL (ref 0.0–0.1)
Basophils Relative: 1 %
Eosinophils Absolute: 0.1 K/uL (ref 0.0–0.5)
Eosinophils Relative: 3 %
HCT: 44.5 % (ref 39.0–52.0)
Hemoglobin: 15 g/dL (ref 13.0–17.0)
Immature Granulocytes: 0 %
Lymphocytes Relative: 24 %
Lymphs Abs: 1.1 K/uL (ref 0.7–4.0)
MCH: 31.3 pg (ref 26.0–34.0)
MCHC: 33.7 g/dL (ref 30.0–36.0)
MCV: 92.7 fL (ref 80.0–100.0)
Monocytes Absolute: 0.5 K/uL (ref 0.1–1.0)
Monocytes Relative: 11 %
Neutro Abs: 2.9 K/uL (ref 1.7–7.7)
Neutrophils Relative %: 61 %
Platelet Count: 257 K/uL (ref 150–400)
RBC: 4.8 MIL/uL (ref 4.22–5.81)
RDW: 12.3 % (ref 11.5–15.5)
WBC Count: 4.7 K/uL (ref 4.0–10.5)
nRBC: 0 % (ref 0.0–0.2)

## 2021-06-11 LAB — IRON AND TIBC
Iron: 130 ug/dL (ref 42–163)
Saturation Ratios: 33 % (ref 20–55)
TIBC: 399 ug/dL (ref 202–409)
UIBC: 269 ug/dL (ref 117–376)

## 2021-06-11 LAB — FERRITIN: Ferritin: 61 ng/mL (ref 24–336)

## 2021-06-11 LAB — RETICULOCYTES
Immature Retic Fract: 9 % (ref 2.3–15.9)
RBC.: 4.82 MIL/uL (ref 4.22–5.81)
Retic Count, Absolute: 97.8 10*3/uL (ref 19.0–186.0)
Retic Ct Pct: 2 % (ref 0.4–3.1)

## 2021-06-11 NOTE — Progress Notes (Signed)
Hematology and Oncology Follow Up Visit  Timothy Hoffman [id number] [birth year] 60 y.o. 06/11/2021   Principle Diagnosis:  Iron deficiency anemia    Current Therapy:        IV iron as indicated   Interim History:  Mr. Timothy Hoffman is here today for follow-up. He is doing quite well and has no complaints at this time.  He had Covid in late July and thankfully had only light symptoms.  No fever, chills, n/v, cough, rash, dizziness, SOB, chest pain, palpitations, abdominal pain or changes in bowel or bladder habits.  No blood loss noted. No bruising or petechiae.  Hgb is 15.0, MCV 92, platelets 257 and WBC count 4.7.  No swelling, tenderness, numbness or tingling in his extremities.  No falls or syncope.  He is eating well and staying properly hydrated. His weight is stable at 187 lbs.  He is looking forward to helping his daughter into college for her Freshman year.   ECOG Performance Status: 0 - Asymptomatic  Medications:  Allergies as of 06/11/2021       Reactions   Rose Hips [ascorbate] Itching, Rash   Tequin Rash        Medication List        Accurate as of June 11, 2021  9:59 AM. If you have any questions, ask your nurse or doctor.          amLODipine 5 MG tablet Commonly known as: NORVASC Take 1 tablet (5 mg total) by mouth daily.   Carestart COVID-19 Home Test Kit Generic drug: COVID-19 At Home Antigen Test use as directed   ezetimibe 10 MG tablet Commonly known as: ZETIA Take 1 tablet (10 mg total) by mouth daily.   hydrocortisone cream 1 % Apply 1 application topically daily as needed (poison ivy).   ibuprofen 200 MG tablet Commonly known as: ADVIL Take 600 mg by mouth every 6 (six) hours as needed for headache or moderate pain.   loratadine 10 MG tablet Commonly known as: CLARITIN Take 10 mg by mouth daily as needed for allergies.   Pfizer-BioNT COVID-19 Vac-TriS Susp injection Generic drug: COVID-19 mRNA Vac-TriS (Pfizer) Inject into the muscle.    PreviDent 0.2 % Soln Generic drug: SODIUM FLUORIDE (DENTAL RINSE) Take by mouth as directed.   rosuvastatin 40 MG tablet Commonly known as: CRESTOR Take 1 tablet (40 mg total) by mouth daily.   sertraline 50 MG tablet Commonly known as: ZOLOFT Take 1 tablet (50 mg total) by mouth daily.   sildenafil 20 MG tablet Commonly known as: REVATIO Take 3 tablets by mouth as needed.   triamcinolone cream 0.1 % Commonly known as: KENALOG Apply topically daily. What changed:  how much to take when to take this reasons to take this        Allergies:  Allergies  Allergen Reactions   Rose Hips [Ascorbate] Itching and Rash   Tequin Rash    Past Medical History, Surgical history, Social history, and Family History were reviewed and updated.  Review of Systems: All other 10 point review of systems is negative.   Physical Exam:  vitals were not taken for this visit.   Wt Readings from Last 3 Encounters:  05/27/21 182 lb (82.6 kg)  02/06/21 191 lb 12.8 oz (87 kg)  01/23/21 191 lb 12.8 oz (87 kg)    Ocular: Sclerae unicteric, pupils equal, round and reactive to light Ear-nose-throat: Oropharynx clear, dentition fair Lymphatic: No cervical or supraclavicular adenopathy Lungs no rales or rhonchi, good  excursion bilaterally Heart regular rate and rhythm, no murmur appreciated Abd soft, nontender, positive bowel sounds MSK no focal spinal tenderness, no joint edema Neuro: non-focal, well-oriented, appropriate affect Breasts: Deferred   Lab Results  Component Value Date   WBC 4.7 06/11/2021   HGB 15.0 06/11/2021   HCT 44.5 06/11/2021   MCV 92.7 06/11/2021   PLT 257 06/11/2021   Lab Results  Component Value Date   FERRITIN 75 10/02/2020   IRON 106 10/02/2020   TIBC 424 (H) 10/02/2020   UIBC 317 10/02/2020   IRONPCTSAT 25 10/02/2020   Lab Results  Component Value Date   RETICCTPCT 2.0 06/11/2021   RBC 4.82 06/11/2021   RBC 4.80 06/11/2021   RETICCTABS 64,680  03/15/2019   No results found for: KPAFRELGTCHN, LAMBDASER, KAPLAMBRATIO No results found for: IGGSERUM, IGA, IGMSERUM No results found for: Odetta Pink, SPEI   Chemistry      Component Value Date/Time   NA 140 05/10/2020 1413   NA 140 07/24/2017 1554   K 4.5 05/10/2020 1413   CL 103 05/10/2020 1413   CO2 27 05/10/2020 1413   BUN 21 05/10/2020 1413   BUN 26 (H) 07/24/2017 1554   CREATININE 0.84 05/10/2020 1413   CREATININE 0.86 04/04/2019 1327   CREATININE 0.94 06/24/2016 1223      Component Value Date/Time   CALCIUM 9.8 05/10/2020 1413   ALKPHOS 63 05/10/2020 1413   AST 40 (H) 05/10/2020 1413   AST 27 04/04/2019 1327   ALT 41 05/10/2020 1413   ALT 22 04/04/2019 1327   BILITOT 0.9 05/10/2020 1413   BILITOT 0.4 04/04/2019 1327       Impression and Plan: Mr. Timothy Hoffman is a very pleasant 60 yo caucasian gentleman with iron deficiency anemia secondary to blood loss with hemorrhoids. Since his hemorrhoidectomy in September 2020 he has done well and his anemia seems have to resolved.  At this time we will let him follow-up as needed.  He can contact our office with any questions or concerns. We can certainly see him any time he may need [place].   Timothy Peace, NP 8/9/20229:59 AM

## 2021-06-12 ENCOUNTER — Telehealth: Payer: Self-pay | Admitting: *Deleted

## 2021-06-12 NOTE — Telephone Encounter (Signed)
Per 06/11/21 los - called and lvm of upcoming appointments - requested call back to confirm

## 2021-06-24 ENCOUNTER — Encounter: Payer: Self-pay | Admitting: Family Medicine

## 2021-06-24 ENCOUNTER — Other Ambulatory Visit: Payer: Self-pay | Admitting: Family Medicine

## 2021-06-24 ENCOUNTER — Other Ambulatory Visit (HOSPITAL_COMMUNITY): Payer: Self-pay

## 2021-06-24 DIAGNOSIS — E789 Disorder of lipoprotein metabolism, unspecified: Secondary | ICD-10-CM

## 2021-06-24 MED ORDER — ROSUVASTATIN CALCIUM 40 MG PO TABS
40.0000 mg | ORAL_TABLET | Freq: Every day | ORAL | 1 refills | Status: DC
  Filled 2021-06-24: qty 90, 90d supply, fill #0
  Filled 2021-09-24: qty 90, 90d supply, fill #1

## 2021-06-24 MED ORDER — SERTRALINE HCL 50 MG PO TABS
50.0000 mg | ORAL_TABLET | Freq: Every day | ORAL | 1 refills | Status: DC
  Filled 2021-06-24: qty 90, 90d supply, fill #0
  Filled 2021-09-25: qty 90, 90d supply, fill #1

## 2021-06-24 MED ORDER — EZETIMIBE 10 MG PO TABS
10.0000 mg | ORAL_TABLET | Freq: Every day | ORAL | 1 refills | Status: DC
  Filled 2021-06-24: qty 90, 90d supply, fill #0
  Filled 2021-09-24: qty 90, 90d supply, fill #1

## 2021-08-07 ENCOUNTER — Other Ambulatory Visit (HOSPITAL_COMMUNITY): Payer: Self-pay

## 2021-09-24 ENCOUNTER — Other Ambulatory Visit (HOSPITAL_COMMUNITY): Payer: Self-pay

## 2021-09-25 ENCOUNTER — Other Ambulatory Visit (HOSPITAL_COMMUNITY): Payer: Self-pay

## 2021-11-01 ENCOUNTER — Other Ambulatory Visit: Payer: Self-pay | Admitting: Family Medicine

## 2021-11-01 ENCOUNTER — Other Ambulatory Visit (HOSPITAL_COMMUNITY): Payer: Self-pay

## 2021-11-01 ENCOUNTER — Encounter: Payer: Self-pay | Admitting: Family Medicine

## 2021-11-01 MED ORDER — AMLODIPINE BESYLATE 5 MG PO TABS
5.0000 mg | ORAL_TABLET | Freq: Every day | ORAL | 1 refills | Status: DC
  Filled 2021-11-01: qty 90, 90d supply, fill #0
  Filled 2021-12-31 – 2022-02-06 (×2): qty 90, 90d supply, fill #1

## 2021-12-31 ENCOUNTER — Encounter: Payer: Self-pay | Admitting: Family Medicine

## 2021-12-31 ENCOUNTER — Other Ambulatory Visit: Payer: Self-pay | Admitting: Family Medicine

## 2021-12-31 DIAGNOSIS — E789 Disorder of lipoprotein metabolism, unspecified: Secondary | ICD-10-CM

## 2022-01-01 ENCOUNTER — Other Ambulatory Visit (HOSPITAL_COMMUNITY): Payer: Self-pay

## 2022-01-01 MED ORDER — ROSUVASTATIN CALCIUM 40 MG PO TABS
40.0000 mg | ORAL_TABLET | Freq: Every day | ORAL | 0 refills | Status: DC
  Filled 2022-01-01: qty 90, 90d supply, fill #0

## 2022-01-01 MED ORDER — EZETIMIBE 10 MG PO TABS
10.0000 mg | ORAL_TABLET | Freq: Every day | ORAL | 0 refills | Status: DC
  Filled 2022-01-01: qty 90, 90d supply, fill #0

## 2022-01-02 ENCOUNTER — Other Ambulatory Visit: Payer: Self-pay | Admitting: Family Medicine

## 2022-01-02 ENCOUNTER — Encounter: Payer: Self-pay | Admitting: Family Medicine

## 2022-01-03 ENCOUNTER — Other Ambulatory Visit (HOSPITAL_COMMUNITY): Payer: Self-pay

## 2022-01-03 MED ORDER — SERTRALINE HCL 50 MG PO TABS
50.0000 mg | ORAL_TABLET | Freq: Every day | ORAL | 1 refills | Status: DC
  Filled 2022-01-03: qty 90, 90d supply, fill #0
  Filled 2022-04-17: qty 90, 90d supply, fill #1

## 2022-02-06 ENCOUNTER — Other Ambulatory Visit (HOSPITAL_COMMUNITY): Payer: Self-pay

## 2022-04-10 ENCOUNTER — Encounter: Payer: Self-pay | Admitting: Family Medicine

## 2022-04-10 ENCOUNTER — Other Ambulatory Visit (HOSPITAL_COMMUNITY): Payer: Self-pay

## 2022-04-10 ENCOUNTER — Other Ambulatory Visit: Payer: Self-pay | Admitting: Family Medicine

## 2022-04-10 DIAGNOSIS — E789 Disorder of lipoprotein metabolism, unspecified: Secondary | ICD-10-CM

## 2022-04-10 MED ORDER — EZETIMIBE 10 MG PO TABS
10.0000 mg | ORAL_TABLET | Freq: Every day | ORAL | 0 refills | Status: DC
  Filled 2022-04-10: qty 90, 90d supply, fill #0

## 2022-04-10 MED ORDER — ROSUVASTATIN CALCIUM 40 MG PO TABS
40.0000 mg | ORAL_TABLET | Freq: Every day | ORAL | 0 refills | Status: DC
  Filled 2022-04-10: qty 90, 90d supply, fill #0

## 2022-04-17 ENCOUNTER — Other Ambulatory Visit (HOSPITAL_COMMUNITY): Payer: Self-pay

## 2022-04-22 ENCOUNTER — Ambulatory Visit: Payer: No Typology Code available for payment source | Admitting: Family Medicine

## 2022-05-13 ENCOUNTER — Other Ambulatory Visit (HOSPITAL_COMMUNITY): Payer: Self-pay

## 2022-05-13 ENCOUNTER — Telehealth (INDEPENDENT_AMBULATORY_CARE_PROVIDER_SITE_OTHER): Payer: No Typology Code available for payment source | Admitting: Family

## 2022-05-13 ENCOUNTER — Telehealth: Payer: Self-pay | Admitting: *Deleted

## 2022-05-13 VITALS — Wt 182.0 lb

## 2022-05-13 DIAGNOSIS — I10 Essential (primary) hypertension: Secondary | ICD-10-CM

## 2022-05-13 DIAGNOSIS — E785 Hyperlipidemia, unspecified: Secondary | ICD-10-CM | POA: Diagnosis not present

## 2022-05-13 DIAGNOSIS — D508 Other iron deficiency anemias: Secondary | ICD-10-CM

## 2022-05-13 DIAGNOSIS — E789 Disorder of lipoprotein metabolism, unspecified: Secondary | ICD-10-CM

## 2022-05-13 DIAGNOSIS — F411 Generalized anxiety disorder: Secondary | ICD-10-CM

## 2022-05-13 DIAGNOSIS — Z87898 Personal history of other specified conditions: Secondary | ICD-10-CM

## 2022-05-13 DIAGNOSIS — Z86010 Personal history of colonic polyps: Secondary | ICD-10-CM

## 2022-05-13 DIAGNOSIS — R972 Elevated prostate specific antigen [PSA]: Secondary | ICD-10-CM

## 2022-05-13 MED ORDER — AMLODIPINE BESYLATE 5 MG PO TABS
5.0000 mg | ORAL_TABLET | Freq: Every day | ORAL | 1 refills | Status: DC
  Filled 2022-05-13 – 2022-06-19 (×2): qty 90, 90d supply, fill #0
  Filled 2022-10-01: qty 90, 90d supply, fill #1

## 2022-05-13 NOTE — Assessment & Plan Note (Signed)
He has follow up scheduled with hematology in August.

## 2022-05-13 NOTE — Assessment & Plan Note (Addendum)
Lab Results  Component Value Date   PSA1 1.9 07/24/2017   PSA 2.24 09/06/2018   PSA 2.0 06/24/2016   PSA 0.99 06/02/2013    Reports that he saw [facility] and follow up PSA was normal. Will plan to recheck with coming labs.

## 2022-05-13 NOTE — Assessment & Plan Note (Addendum)
Reports that his bp has been stable. Last week was 120/80.  He will ask his daughter to check his blood pressure and he will send me his reading this evening. Continue current dose of amlodipine.

## 2022-05-13 NOTE — Assessment & Plan Note (Signed)
Colonoscopy is up to date.

## 2022-05-13 NOTE — Assessment & Plan Note (Signed)
Stable on current dose of sertraline. Continue same.

## 2022-05-13 NOTE — Assessment & Plan Note (Addendum)
Lab Results  Component Value Date   CHOL 212 (H) 05/10/2020   HDL 58.00 05/10/2020   LDLCALC 85 03/15/2019   LDLDIRECT 114.0 05/10/2020   TRIG 256.0 (H) 05/10/2020   CHOLHDL 4 05/10/2020   On crestor/zetia. He will return for fasting lipid panel.

## 2022-05-13 NOTE — Progress Notes (Signed)
MyChart Video Visit    Virtual Visit via Video Note   This visit type was conducted due to national recommendations for restrictions regarding the COVID-19 Pandemic (e.g. social distancing) in an effort to limit this patient's exposure and mitigate transmission in our community. This patient is at least at moderate risk for complications without adequate follow up. This format is felt to be most appropriate for this patient at this time. Physical exam was limited by quality of the video and audio technology used for the visit. CMA was able to get the patient set up on a video visit.  Patient location: Home patient and provider in visit Provider location: Office  I discussed the limitations of evaluation and management by telemedicine and the availability of in person appointments. The patient expressed understanding and agreed to proceed.  Visit Date: 05/13/2022.  Today's healthcare provider: Nance Pear, NP     Subjective:    Patient ID: Timothy Hoffman, male    DOB: 09-03-1961, 61 y.o.   MRN: [id number]  No chief complaint on file.   HPI Patient is in today for a virtual visit. He reports that he is doing well.  Immunizations- He is due for his second shingles immunization and tetanus immunization. He is interested in receiving the the updated COVID-19 booster.  Blood pressure- He is currently taking Amlodipine 5 mg to manage his blood pressure. He reports that has blood pressure has within normal ranges and at around 120/80. BP Readings from Last 3 Encounters:  06/11/21 (!) 149/93  02/06/21 (!) 141/98  10/08/20 (!) 130/98   Pulse Readings from Last 3 Encounters:  06/11/21 86  05/27/21 72  02/06/21 79   Weight- He reports that his weight is  decreasing. He is walking 6 miles a week and maintains a healthy diet.  Wt Readings from Last 3 Encounters:  05/13/22 182 lb (82.6 kg)  06/11/21 187 lb (84.8 kg)  05/27/21 182 lb (82.6 kg)    Cholesterol- He is  currently taking Crestor 40 mg and Zetia 10 mg to manage his cholesterol.  Lab Results  Component Value Date   CHOL 212 (H) 05/10/2020   HDL 58.00 05/10/2020   LDLCALC 85 03/15/2019   LDLDIRECT 114.0 05/10/2020   TRIG 256.0 (H) 05/10/2020   CHOLHDL 4 05/10/2020   Iron- He states that he was anemic two years ago but is currently doing well. He reports receiving iron infusions and a hemorrhoidectomy that restored his iron levels.   Lab Results  Component Value Date   IRON 130 06/11/2021   TIBC 399 06/11/2021   FERRITIN 61 06/11/2021   Anxiety- He is currently taking Sertraline 50 mg to manage his anxiety and reports that is has been effective.   Past Medical History:  Diagnosis Date   Allergy    SEASONAL   Anemia    IV iron x3 05/2019   Anxiety    Arrhythmia 1999   Atopic dermatitis    Diverticulosis    History of colon polyps    Hyperlipemia    Internal hemorrhoid, bleeding 2013   PONV (postoperative nausea and vomiting)     Past Surgical History:  Procedure Laterality Date   COLONOSCOPY     EVALUATION UNDER ANESTHESIA WITH HEMORRHOIDECTOMY N/A 07/07/2019   Procedure: HEMORRHOIDECTOMY, HEMORRHOIDAL LIGATION/PEXY ANORECTAL EXAM UNDER ANESTHESIA;  Surgeon: Michael Boston, MD;  Location: [facility];  Service: General;  Laterality: N/A;  LOCAL   KNEE ARTHROSCOPY Right 2012   Meniscal repair  POLYPECTOMY     Right shoulder scope     WISDOM TOOTH EXTRACTION      Family History  Problem Relation Age of Onset   Lymphoma Father    Cancer Father        nonHodgkin's lymphoma   Hyperlipidemia Mother    Hypertension Mother    Hypertension Brother    Hyperlipidemia Brother    Mental illness Son        Anxiety   Anxiety disorder Son    Stroke Maternal Grandmother    Colon cancer Neg Hx    Colon polyps Neg Hx    Esophageal cancer Neg Hx    Rectal cancer Neg Hx    Stomach cancer Neg Hx     Social History   Socioeconomic History   Marital status: Married    Spouse name:  Timothy Hoffman   Number of children: 2   Years of education: college   Highest education level: Not on file  Occupational History   Occupation: Automotive engineer: [employer]  Tobacco Use   Smoking status: Never   Smokeless tobacco: Never   Tobacco comments:    20 yrs ago   Vaping Use   Vaping Use: Never used  Substance and Sexual Activity   Alcohol use: Yes    Alcohol/week: 2.0 standard drinks of alcohol    Types: 2 Cans of beer per week   Drug use: No   Sexual activity: Yes    Partners: Female  Other Topics Concern   Not on file  Social History Narrative   Lives with his wife and their 2 children.     His wife is an Therapist, sports.     Their son has significant anxiety.   He exercises by running 3-5 miles 4x/week.      Vegan   Social Determinants of Health   Financial Resource Strain: Low Risk  (09/06/2018)   Overall Financial Resource Strain (CARDIA)    Difficulty of Paying Living Expenses: Not hard at all  Food Insecurity: No Food Insecurity (09/06/2018)   Hunger Vital Sign    Worried About Running Out of Food in the Last Year: Never true    Ran Out of Food in the Last Year: Never true  Transportation Needs: No Transportation Needs (09/06/2018)   PRAPARE - Hydrologist (Medical): No    Lack of Transportation (Non-Medical): No  Physical Activity: Sufficiently Active (09/06/2018)   Exercise Vital Sign    Days of Exercise per Week: 6 days    Minutes of Exercise per Session: 50 min  Stress: Not on file  Social Connections: Somewhat Isolated (09/06/2018)   Social Connection and Isolation Panel [NHANES]    Frequency of Communication with Friends and Family: More than three times a week    Frequency of Social Gatherings with Friends and Family: Three times a week    Attends Religious Services: Never    Active Member of Clubs or Organizations: No    Attends Archivist Meetings: Never    Marital Status: Married  Human resources officer Violence:  Not At Risk (09/06/2018)   Humiliation, Afraid, Rape, and Kick questionnaire    Fear of Current or Ex-Partner: No    Emotionally Abused: No    Physically Abused: No    Sexually Abused: No    Outpatient Medications Prior to Visit  Medication Sig Dispense Refill   ezetimibe (ZETIA) 10 MG tablet Take 1 tablet (10 mg total) by  mouth daily. 90 tablet 0   hydrocortisone cream 1 % Apply 1 application topically daily as needed (poison ivy).     loratadine (CLARITIN) 10 MG tablet Take 10 mg by mouth daily as needed for allergies.     PREVIDENT 0.2 % SOLN Take by mouth as directed.     rosuvastatin (CRESTOR) 40 MG tablet Take 1 tablet (40 mg total) by mouth daily. 90 tablet 0   sertraline (ZOLOFT) 50 MG tablet Take 1 tablet (50 mg total) by mouth daily. 90 tablet 1   sildenafil (REVATIO) 20 MG tablet Take 3 tablets by mouth as needed.     triamcinolone cream (KENALOG) 0.1 % Apply topically daily. (Patient taking differently: Apply 1 application topically daily as needed (atopic dermatitis).) 80 g 1   amLODipine (NORVASC) 5 MG tablet Take 1 tablet (5 mg total) by mouth daily. 90 tablet 1   No facility-administered medications prior to visit.    Allergies  Allergen Reactions   Rose Hips [Ascorbate] Itching and Rash   Tequin Rash    ROS    See HPI Objective:    Physical Exam Constitutional:      Appearance: Normal appearance.  Pulmonary:     Effort: Pulmonary effort is normal.  Neurological:     Mental Status: He is alert and oriented to person, place, and time.  Psychiatric:        Mood and Affect: Mood normal.        Behavior: Behavior normal.        Thought Content: Thought content normal.        Judgment: Judgment normal.     Wt 182 lb (82.6 kg)   BMI 26.11 kg/m  Wt Readings from Last 3 Encounters:  05/13/22 182 lb (82.6 kg)  06/11/21 187 lb (84.8 kg)  05/27/21 182 lb (82.6 kg)   Assessment & Plan:   Problem List Items Addressed This Visit       Unprioritized    Generalized anxiety disorder (Chronic)    Stable on current dose of sertraline. Continue same.       PSA elevation    Lab Results  Component Value Date   PSA1 1.9 07/24/2017   PSA 2.24 09/06/2018   PSA 2.0 06/24/2016   PSA 0.99 06/02/2013   Reports that he saw [facility] and follow up PSA was normal. Will plan to recheck with coming labs.       Lipid disorder    Lab Results  Component Value Date   CHOL 212 (H) 05/10/2020   HDL 58.00 05/10/2020   LDLCALC 85 03/15/2019   LDLDIRECT 114.0 05/10/2020   TRIG 256.0 (H) 05/10/2020   CHOLHDL 4 05/10/2020  On crestor/zetia. He will return for fasting lipid panel.       IDA (iron deficiency anemia)    He has follow up scheduled with hematology in August.       Hypertension    Reports that his bp has been stable. Last week was 120/80.  He will ask his daughter to check his blood pressure and he will send me his reading this evening. Continue current dose of amlodipine.       Relevant Medications   amLODipine (NORVASC) 5 MG tablet   Other Relevant Orders   Comp Met (CMET)   History of colon polyps    Colonoscopy is up to date.       Other Visit Diagnoses     Hyperlipidemia, unspecified hyperlipidemia type    -  Primary   Relevant Medications   amLODipine (NORVASC) 5 MG tablet   Other Relevant Orders   Lipid panel   History of elevated PSA       Relevant Orders   PSA       Meds ordered this encounter  Medications   amLODipine (NORVASC) 5 MG tablet    Sig: Take 1 tablet (5 mg total) by mouth daily.    Dispense:  90 tablet    Refill:  1    Order Specific Question:   Supervising Provider    Answer:   Penni Homans A [[id number]]    I discussed the assessment and treatment plan with the patient. The patient was provided an opportunity to ask questions and all were answered. The patient agreed with the plan and demonstrated an understanding of the instructions.   The patient was advised to call back or seek an  in-person evaluation if the symptoms worsen or if the condition fails to improve as anticipated.  I provided 20 minutes of face-to-face time during this encounter.  I,Mohammed Iqbal,acting as a Education administrator for [employer], NP.,have documented all relevant documentation on the behalf of Nance Pear, NP,as directed by  Nance Pear, NP while in the presence of Nance Pear, NP.   Nance Pear, NP [employer] [facility] [employer] [facility] [phone] (phone) [phone] (fax)  [place]

## 2022-05-13 NOTE — Telephone Encounter (Signed)
Left message on machine and mychart message sent.  "For the appointment this evening we were seeing if you are able to come in.  Your last visit was 2021.  If you are expecting physical, then we definitely need to see you in person.  Please let us know if you can change to in person visit.  We do have a later time if needed after 5pm today."

## 2022-05-21 ENCOUNTER — Other Ambulatory Visit (HOSPITAL_COMMUNITY): Payer: Self-pay

## 2022-06-11 ENCOUNTER — Other Ambulatory Visit: Payer: No Typology Code available for payment source

## 2022-06-11 ENCOUNTER — Ambulatory Visit: Payer: No Typology Code available for payment source | Admitting: Family

## 2022-06-13 ENCOUNTER — Other Ambulatory Visit: Payer: Self-pay | Admitting: Family

## 2022-06-13 DIAGNOSIS — D500 Iron deficiency anemia secondary to blood loss (chronic): Secondary | ICD-10-CM

## 2022-06-16 ENCOUNTER — Inpatient Hospital Stay: Payer: No Typology Code available for payment source | Admitting: Family

## 2022-06-16 ENCOUNTER — Inpatient Hospital Stay: Payer: No Typology Code available for payment source

## 2022-06-19 ENCOUNTER — Other Ambulatory Visit (HOSPITAL_COMMUNITY): Payer: Self-pay

## 2022-07-28 ENCOUNTER — Other Ambulatory Visit (HOSPITAL_COMMUNITY): Payer: Self-pay

## 2022-07-28 ENCOUNTER — Other Ambulatory Visit: Payer: Self-pay | Admitting: Family Medicine

## 2022-07-28 DIAGNOSIS — E789 Disorder of lipoprotein metabolism, unspecified: Secondary | ICD-10-CM

## 2022-07-28 MED ORDER — SERTRALINE HCL 50 MG PO TABS
50.0000 mg | ORAL_TABLET | Freq: Every day | ORAL | 0 refills | Status: DC
  Filled 2022-07-28: qty 30, 30d supply, fill #0

## 2022-07-28 MED ORDER — EZETIMIBE 10 MG PO TABS
10.0000 mg | ORAL_TABLET | Freq: Every day | ORAL | 0 refills | Status: DC
  Filled 2022-07-28: qty 30, 30d supply, fill #0

## 2022-07-29 ENCOUNTER — Other Ambulatory Visit (HOSPITAL_COMMUNITY): Payer: Self-pay

## 2022-07-29 ENCOUNTER — Other Ambulatory Visit: Payer: Self-pay | Admitting: Family Medicine

## 2022-07-29 DIAGNOSIS — E789 Disorder of lipoprotein metabolism, unspecified: Secondary | ICD-10-CM

## 2022-07-29 MED ORDER — ROSUVASTATIN CALCIUM 40 MG PO TABS
40.0000 mg | ORAL_TABLET | Freq: Every day | ORAL | 0 refills | Status: DC
  Filled 2022-07-29: qty 90, 90d supply, fill #0

## 2022-08-19 ENCOUNTER — Encounter: Payer: No Typology Code available for payment source | Admitting: Family

## 2022-08-26 ENCOUNTER — Other Ambulatory Visit (HOSPITAL_COMMUNITY): Payer: Self-pay

## 2022-08-26 ENCOUNTER — Other Ambulatory Visit: Payer: Self-pay | Admitting: Family Medicine

## 2022-08-26 DIAGNOSIS — E789 Disorder of lipoprotein metabolism, unspecified: Secondary | ICD-10-CM

## 2022-08-26 MED ORDER — EZETIMIBE 10 MG PO TABS
10.0000 mg | ORAL_TABLET | Freq: Every day | ORAL | 0 refills | Status: DC
  Filled 2022-08-26: qty 30, 30d supply, fill #0

## 2022-09-03 ENCOUNTER — Other Ambulatory Visit (HOSPITAL_COMMUNITY): Payer: Self-pay

## 2022-09-03 ENCOUNTER — Telehealth: Payer: Self-pay | Admitting: Family Medicine

## 2022-09-03 ENCOUNTER — Other Ambulatory Visit: Payer: Self-pay | Admitting: Family Medicine

## 2022-09-03 MED ORDER — SERTRALINE HCL 50 MG PO TABS
50.0000 mg | ORAL_TABLET | Freq: Every day | ORAL | 0 refills | Status: DC
  Filled 2022-09-03: qty 30, 30d supply, fill #0

## 2022-09-03 NOTE — Telephone Encounter (Signed)
Medication sent.

## 2022-09-03 NOTE — Telephone Encounter (Signed)
Patient needs refill on sertraline (ZOLOFT) 50 MG tablet . He didn't realize he was out of refills and is going out of town in 3 hours and would like to see if it could be expedited. Advised that he needed to be seen before more refills would be granted. Patient has appt scheduled for 12/4 which is the next available for his physical. Patient would like refill to hold him over until then.

## 2022-10-01 ENCOUNTER — Other Ambulatory Visit (HOSPITAL_COMMUNITY): Payer: Self-pay

## 2022-10-01 ENCOUNTER — Other Ambulatory Visit: Payer: Self-pay | Admitting: Family Medicine

## 2022-10-01 ENCOUNTER — Encounter: Payer: Self-pay | Admitting: Family

## 2022-10-01 DIAGNOSIS — E789 Disorder of lipoprotein metabolism, unspecified: Secondary | ICD-10-CM

## 2022-10-01 MED ORDER — AMLODIPINE BESYLATE 5 MG PO TABS
5.0000 mg | ORAL_TABLET | Freq: Every day | ORAL | 0 refills | Status: DC
  Filled 2022-10-01: qty 30, 30d supply, fill #0

## 2022-10-01 MED ORDER — SERTRALINE HCL 50 MG PO TABS
50.0000 mg | ORAL_TABLET | Freq: Every day | ORAL | 0 refills | Status: DC
  Filled 2022-10-01: qty 30, 30d supply, fill #0

## 2022-10-01 MED ORDER — EZETIMIBE 10 MG PO TABS
10.0000 mg | ORAL_TABLET | Freq: Every day | ORAL | 0 refills | Status: DC
  Filled 2022-10-01: qty 30, 30d supply, fill #0

## 2022-10-02 ENCOUNTER — Other Ambulatory Visit (HOSPITAL_COMMUNITY): Payer: Self-pay

## 2022-10-06 ENCOUNTER — Ambulatory Visit (INDEPENDENT_AMBULATORY_CARE_PROVIDER_SITE_OTHER): Payer: No Typology Code available for payment source | Admitting: Family

## 2022-10-06 ENCOUNTER — Other Ambulatory Visit (HOSPITAL_COMMUNITY): Payer: Self-pay

## 2022-10-06 ENCOUNTER — Encounter: Payer: Self-pay | Admitting: Family

## 2022-10-06 VITALS — BP 172/104 | HR 94 | Temp 98.5°F | Resp 16 | Ht 70.0 in | Wt 187.0 lb

## 2022-10-06 DIAGNOSIS — I10 Essential (primary) hypertension: Secondary | ICD-10-CM

## 2022-10-06 DIAGNOSIS — Z862 Personal history of diseases of the blood and blood-forming organs and certain disorders involving the immune mechanism: Secondary | ICD-10-CM | POA: Diagnosis not present

## 2022-10-06 DIAGNOSIS — Z0000 Encounter for general adult medical examination without abnormal findings: Secondary | ICD-10-CM

## 2022-10-06 DIAGNOSIS — E789 Disorder of lipoprotein metabolism, unspecified: Secondary | ICD-10-CM

## 2022-10-06 DIAGNOSIS — R972 Elevated prostate specific antigen [PSA]: Secondary | ICD-10-CM | POA: Diagnosis not present

## 2022-10-06 DIAGNOSIS — Z23 Encounter for immunization: Secondary | ICD-10-CM

## 2022-10-06 DIAGNOSIS — Z125 Encounter for screening for malignant neoplasm of prostate: Secondary | ICD-10-CM

## 2022-10-06 DIAGNOSIS — F411 Generalized anxiety disorder: Secondary | ICD-10-CM | POA: Diagnosis not present

## 2022-10-06 LAB — CBC WITH DIFFERENTIAL/PLATELET
Basophils Absolute: 0 10*3/uL (ref 0.0–0.1)
Basophils Relative: 0.6 % (ref 0.0–3.0)
Eosinophils Absolute: 0.1 10*3/uL (ref 0.0–0.7)
Eosinophils Relative: 1.2 % (ref 0.0–5.0)
HCT: 43 % (ref 39.0–52.0)
Hemoglobin: 14.8 g/dL (ref 13.0–17.0)
Lymphocytes Relative: 17.2 % (ref 12.0–46.0)
Lymphs Abs: 0.8 10*3/uL (ref 0.7–4.0)
MCHC: 34.4 g/dL (ref 30.0–36.0)
MCV: 91.6 fl (ref 78.0–100.0)
Monocytes Absolute: 0.5 10*3/uL (ref 0.1–1.0)
Monocytes Relative: 11.5 % (ref 3.0–12.0)
Neutro Abs: 3.2 10*3/uL (ref 1.4–7.7)
Neutrophils Relative %: 69.5 % (ref 43.0–77.0)
Platelets: 264 10*3/uL (ref 150.0–400.0)
RBC: 4.7 Mil/uL (ref 4.22–5.81)
RDW: 13.4 % (ref 11.5–15.5)
WBC: 4.7 10*3/uL (ref 4.0–10.5)

## 2022-10-06 LAB — LIPID PANEL
Cholesterol: 206 mg/dL — ABNORMAL HIGH (ref 0–200)
HDL: 67.9 mg/dL (ref >39.00)
LDL Cholesterol: 100 mg/dL — ABNORMAL HIGH (ref 0–99)
NonHDL: 137.76
Total CHOL/HDL Ratio: 3
Triglycerides: 189 mg/dL — ABNORMAL HIGH (ref 0.0–149.0)
VLDL: 37.8 mg/dL (ref 0.0–40.0)

## 2022-10-06 LAB — COMPREHENSIVE METABOLIC PANEL
ALT: 29 U/L (ref 0–53)
AST: 31 U/L (ref 0–37)
Albumin: 4.5 g/dL (ref 3.5–5.2)
Alkaline Phosphatase: 61 U/L (ref 39–117)
BUN: 22 mg/dL (ref 6–23)
CO2: 26 mEq/L (ref 19–32)
Calcium: 9.4 mg/dL (ref 8.4–10.5)
Chloride: 104 mEq/L (ref 96–112)
Creatinine, Ser: 0.79 mg/dL (ref 0.40–1.50)
GFR: 94.92 mL/min (ref >60.00)
Glucose, Bld: 106 mg/dL — ABNORMAL HIGH (ref 70–99)
Potassium: 4.4 mEq/L (ref 3.5–5.1)
Sodium: 138 mEq/L (ref 135–145)
Total Bilirubin: 0.6 mg/dL (ref 0.2–1.2)
Total Protein: 7.3 g/dL (ref 6.0–8.3)

## 2022-10-06 LAB — TSH: TSH: 1.7 u[IU]/mL (ref 0.35–5.50)

## 2022-10-06 LAB — PSA: PSA: 1.73 ng/mL (ref 0.10–4.00)

## 2022-10-06 MED ORDER — AMLODIPINE BESYLATE 10 MG PO TABS
10.0000 mg | ORAL_TABLET | Freq: Every day | ORAL | 0 refills | Status: DC
  Filled 2022-10-06: qty 90, 90d supply, fill #0

## 2022-10-06 MED ORDER — TRIAMCINOLONE ACETONIDE 0.1 % EX CREA
TOPICAL_CREAM | Freq: Every day | CUTANEOUS | 1 refills | Status: DC
  Filled 2022-10-06: qty 80, 30d supply, fill #0

## 2022-10-06 NOTE — Assessment & Plan Note (Signed)
Report stable on zoloft '50mg'$ . Continue same.

## 2022-10-06 NOTE — Assessment & Plan Note (Signed)
Lab Results  Component Value Date   PSA1 1.9 07/24/2017   PSA 2.24 09/06/2018   PSA 2.0 06/24/2016   PSA 0.99 06/02/2013   Reports repeat was normal some time back at [facility]. Recheck today.

## 2022-10-06 NOTE — Assessment & Plan Note (Signed)
BP Readings from Last 3 Encounters:  10/06/22 (!) 172/104  06/11/21 (!) 149/93  02/06/21 (!) 141/98   150/93 yesterday at home. Uncontrolled. Will increase amlodipine from '5mg'$  to '10mg'$ .

## 2022-10-06 NOTE — Progress Notes (Addendum)
Subjective:   By signing my name below, I, Timothy Hoffman, attest that this documentation has been prepared under the direction and in the presence of Debbrah Alar, 10/06/2022.     Patient ID: Timothy Hoffman, male    DOB: 29-Sep-1961, 61 y.o.   MRN: [id number]  Chief Complaint  Patient presents with   Annual Exam    HPI Patient is in today for a comprehensive physical exam.  He denies new moles, itching, chills, fever, hearing loss, sinus pain, congestion, sore throat, cough and hemoptysis, chest pain, palpitations, wheezing, constipation, diarrhea, blood in stool, nausea and vomiting, dysuria, frequency, hematuria, myalgias and joint pain, depression, anxiety.  Elevated blood pressure Patients blood pressure is high this visit. He reports that he checks his blood pressure at home and states that yesterday his reading was 150/93. He is complaint with his 5 mg amlodipine. BP Readings from Last 3 Encounters:  10/06/22 (!) 172/104  06/11/21 (!) 149/93  02/06/21 (!) 141/98   Pulse Readings from Last 3 Encounters:  10/06/22 94  06/11/21 86  05/27/21 72   Hyperlipidemia  Patient is complaint with his Crestor 40 mg.   ED Patient states that he has not been using his Viagra because he hasn't needed it.    Anxiety Patient is complaint with Zoloft 50 mg medication and states that it has been helping.  Colonoscopy - last completed on 02/06/2021. PSA - last completed on 09/06/2018.  Immunizations-  Patient is receiving a Tdap and influenza vaccines this visit. Exercise- Patient walks 5 miles everyday. Diet- He reports that he is a vegetarian.  Vision - He is not UTD on vision care. Dental - He is UTD on dental care.  Health Maintenance Due  Topic Date Due   Zoster Vaccines- Shingrix (1 of 2) Never done   COVID-19 Vaccine (4 - 2023-24 season) 07/04/2022    Past Medical History:  Diagnosis Date   Allergy    SEASONAL   Anemia    IV iron x3 05/2019   Anxiety    Arrhythmia  1999   Atopic dermatitis    Diverticulosis    History of colon polyps    Hyperlipemia    Internal hemorrhoid, bleeding 2013   PONV (postoperative nausea and vomiting)     Past Surgical History:  Procedure Laterality Date   COLONOSCOPY     EVALUATION UNDER ANESTHESIA WITH HEMORRHOIDECTOMY N/A 07/07/2019   Procedure: HEMORRHOIDECTOMY, HEMORRHOIDAL LIGATION/PEXY ANORECTAL EXAM UNDER ANESTHESIA;  Surgeon: Michael Boston, MD;  Location: [facility];  Service: General;  Laterality: N/A;  LOCAL   KNEE ARTHROSCOPY Right 2012   Meniscal repair   POLYPECTOMY     Right shoulder scope     WISDOM TOOTH EXTRACTION      Family History  Problem Relation Age of Onset   Lymphoma Father    Cancer Father        nonHodgkin's lymphoma   Hyperlipidemia Mother    Hypertension Mother    Hypertension Brother    Hyperlipidemia Brother    Mental illness Son        Anxiety   Anxiety disorder Son    Stroke Maternal Grandmother    Colon cancer Neg Hx    Colon polyps Neg Hx    Esophageal cancer Neg Hx    Rectal cancer Neg Hx    Stomach cancer Neg Hx     Social History   Socioeconomic History   Marital status: Married    Spouse name: Erline Levine  Number of children: 2   Years of education: college   Highest education level: Not on file  Occupational History   Occupation: Automotive engineer: [employer]  Tobacco Use   Smoking status: Never   Smokeless tobacco: Never   Tobacco comments:    20 yrs ago   Vaping Use   Vaping Use: Never used  Substance and Sexual Activity   Alcohol use: Yes    Alcohol/week: 2.0 standard drinks of alcohol    Types: 2 Cans of beer per week   Drug use: No   Sexual activity: Yes    Partners: Female  Other Topics Concern   Not on file  Social History Narrative   Lives with his wife and son. Daughter is out of the house   His wife is an Therapist, sports.    Their son has significant anxiety.   He exercises by running 3-5 miles 4x/week.   Vegan   Social Determinants of  Health   Financial Resource Strain: Low Risk  (09/06/2018)   Overall Financial Resource Strain (CARDIA)    Difficulty of Paying Living Expenses: Not hard at all  Food Insecurity: No Food Insecurity (09/06/2018)   Hunger Vital Sign    Worried About Running Out of Food in the Last Year: Never true    Ran Out of Food in the Last Year: Never true  Transportation Needs: No Transportation Needs (09/06/2018)   PRAPARE - Hydrologist (Medical): No    Lack of Transportation (Non-Medical): No  Physical Activity: Sufficiently Active (09/06/2018)   Exercise Vital Sign    Days of Exercise per Week: 6 days    Minutes of Exercise per Session: 50 min  Stress: Not on file  Social Connections: Somewhat Isolated (09/06/2018)   Social Connection and Isolation Panel [NHANES]    Frequency of Communication with Friends and Family: More than three times a week    Frequency of Social Gatherings with Friends and Family: Three times a week    Attends Religious Services: Never    Active Member of Clubs or Organizations: No    Attends Archivist Meetings: Never    Marital Status: Married  Human resources officer Violence: Not At Risk (09/06/2018)   Humiliation, Afraid, Rape, and Kick questionnaire    Fear of Current or Ex-Partner: No    Emotionally Abused: No    Physically Abused: No    Sexually Abused: No    Outpatient Medications Prior to Visit  Medication Sig Dispense Refill   ezetimibe (ZETIA) 10 MG tablet Take 1 tablet (10 mg total) by mouth daily. 30 tablet 0   hydrocortisone cream 1 % Apply 1 application topically daily as needed (poison ivy).     loratadine (CLARITIN) 10 MG tablet Take 10 mg by mouth daily as needed for allergies.     PREVIDENT 0.2 % SOLN Take by mouth as directed.     rosuvastatin (CRESTOR) 40 MG tablet Take 1 tablet (40 mg total) by mouth daily. 90 tablet 0   sertraline (ZOLOFT) 50 MG tablet Take 1 tablet (50 mg total) by mouth daily. 30 tablet 0    sildenafil (REVATIO) 20 MG tablet Take 3 tablets by mouth as needed.     amLODipine (NORVASC) 5 MG tablet Take 1 tablet (5 mg total) by mouth daily. 30 tablet 0   triamcinolone cream (KENALOG) 0.1 % Apply topically daily. (Patient taking differently: Apply 1 application  topically daily as needed (atopic dermatitis).)  80 g 1   No facility-administered medications prior to visit.    Allergies  Allergen Reactions   Rose Hips [Ascorbate] Itching and Rash   Tequin Rash    Review of Systems  Skin:        (+) Eczema on left hand        Objective:    Physical Exam Constitutional:      General: He is not in acute distress.    Appearance: Normal appearance. He is not ill-appearing.  HENT:     Head: Normocephalic and atraumatic.     Right Ear: Tympanic membrane, ear canal and external ear normal.     Left Ear: Tympanic membrane, ear canal and external ear normal.  Eyes:     Extraocular Movements: Extraocular movements intact.     Pupils: Pupils are equal, round, and reactive to light.  Cardiovascular:     Rate and Rhythm: Normal rate and regular rhythm.     Heart sounds: Normal heart sounds. No murmur heard.    No gallop.  Pulmonary:     Effort: Pulmonary effort is normal. No respiratory distress.     Breath sounds: Normal breath sounds. No wheezing or rales.  Abdominal:     General: Bowel sounds are normal. There is no distension.     Palpations: Abdomen is soft.     Tenderness: There is no abdominal tenderness. There is no guarding.  Musculoskeletal:     Comments: 5/5 upper and lower extremity strength  Skin:    General: Skin is warm and dry.  Neurological:     Mental Status: He is alert and oriented to person, place, and time.     Deep Tendon Reflexes:     Reflex Scores:      Patellar reflexes are 3+ on the right side and 3+ on the left side. Psychiatric:        Mood and Affect: Mood normal.        Behavior: Behavior normal.        Judgment: Judgment normal.     BP  (!) 172/104   Pulse 94   Temp 98.5 F (36.9 C) (Oral)   Resp 16   Ht _0  (1.778 m)   Wt 187 lb (84.8 kg)   SpO2 99%   BMI 26.83 kg/m  Wt Readings from Last 3 Encounters:  10/06/22 187 lb (84.8 kg)  05/13/22 182 lb (82.6 kg)  06/11/21 187 lb (84.8 kg)       Assessment & Plan:   Problem List Items Addressed This Visit       Unprioritized   Generalized anxiety disorder (Chronic)    Report stable on zoloft 8m. Continue same.       PSA elevation    Lab Results  Component Value Date   PSA1 1.9 07/24/2017   PSA 2.24 09/06/2018   PSA 2.0 06/24/2016   PSA 0.99 06/02/2013  Reports repeat was normal some time back at [facility]. Recheck today.        Preventative health care - Primary    Continue healthy diet and regular exercise. Colo up to date. Flu shot and tdap today. Will return for Shingrix #1 and plan to get Covid booster at his pharmacy.       Relevant Orders   TSH (Completed)   Lipid disorder    Lab Results  Component Value Date   CHOL 212 (H) 05/10/2020   HDL 58.00 05/10/2020   LDLCALC 85 03/15/2019   LDLDIRECT 114.0  05/10/2020   TRIG 256.0 (H) 05/10/2020   CHOLHDL 4 05/10/2020  Overdue for follow up. Will check today- fasting.  Continue crestor.        Hypertension    BP Readings from Last 3 Encounters:  10/06/22 (!) 172/104  06/11/21 (!) 149/93  02/06/21 (!) 141/98  150/93 yesterday at home. Uncontrolled. Will increase amlodipine from 44m to 159m       Relevant Medications   amLODipine (NORVASC) 10 MG tablet   Other Relevant Orders   Lipid panel (Completed)   Comp Met (CMET) (Completed)   Other Visit Diagnoses     Prostate cancer screening       Relevant Orders   PSA (Completed)   History of anemia       Relevant Orders   CBC with Differential/Platelet (Completed)   Need for Tdap vaccination       Relevant Orders   Tdap vaccine greater than or equal to 7yo IM (Completed)   Needs flu shot       Relevant Orders   Flu Vaccine QUAD  6+ mos PF IM (Fluarix Quad PF) (Completed)      Meds ordered this encounter  Medications   amLODipine (NORVASC) 10 MG tablet    Sig: Take 1 tablet (10 mg total) by mouth daily.    Dispense:  90 tablet    Refill:  0    Order Specific Question:   Supervising Provider    Answer:   BLPenni Homans [[id number]]   triamcinolone cream (KENALOG) 0.1 %    Sig: Apply topically daily.    Dispense:  80 g    Refill:  1    Order Specific Question:   Supervising Provider    Answer:   BLPenni Homans [[id number]]    I, MeDebbrah Alarpersonally preformed the services described in this documentation.  All medical record entries made by the scribe were at my direction and in my presence.  I have reviewed the chart and discharge instructions (if applicable) and agree that the record reflects my personal performance and is accurate and complete. 10/06/2022.   I,Verona Buck,acting as a scEducation administratoror MeMarsh & McLennanNP.,have documented all relevant documentation on the behalf of MeNance PearNP,as directed by  MeNance PearNP while in the presence of MeNance PearNP.    MeNance PearNP

## 2022-10-06 NOTE — Assessment & Plan Note (Signed)
Lab Results  Component Value Date   CHOL 212 (H) 05/10/2020   HDL 58.00 05/10/2020   LDLCALC 85 03/15/2019   LDLDIRECT 114.0 05/10/2020   TRIG 256.0 (H) 05/10/2020   CHOLHDL 4 05/10/2020   Overdue for follow up. Will check today- fasting.  Continue crestor.

## 2022-10-06 NOTE — Assessment & Plan Note (Signed)
Continue healthy diet and regular exercise. Colo up to date. Flu shot and tdap today. Will return for Shingrix #1 and plan to get Covid booster at his pharmacy.

## 2022-10-13 ENCOUNTER — Encounter: Payer: Self-pay | Admitting: Family

## 2022-10-16 ENCOUNTER — Encounter: Payer: Self-pay | Admitting: Family

## 2022-10-18 ENCOUNTER — Encounter: Payer: Self-pay | Admitting: Family

## 2022-11-05 ENCOUNTER — Other Ambulatory Visit (HOSPITAL_COMMUNITY): Payer: Self-pay

## 2022-11-05 ENCOUNTER — Encounter: Payer: Self-pay | Admitting: Family

## 2022-11-05 ENCOUNTER — Other Ambulatory Visit: Payer: Self-pay | Admitting: Family Medicine

## 2022-11-05 DIAGNOSIS — E789 Disorder of lipoprotein metabolism, unspecified: Secondary | ICD-10-CM

## 2022-11-05 MED ORDER — EZETIMIBE 10 MG PO TABS
10.0000 mg | ORAL_TABLET | Freq: Every day | ORAL | 1 refills | Status: DC
  Filled 2022-11-05 (×2): qty 90, 90d supply, fill #0
  Filled 2023-02-09: qty 90, 90d supply, fill #1

## 2022-11-05 MED ORDER — ROSUVASTATIN CALCIUM 40 MG PO TABS
40.0000 mg | ORAL_TABLET | Freq: Every day | ORAL | 1 refills | Status: DC
  Filled 2022-11-05: qty 90, 90d supply, fill #0
  Filled 2023-02-09: qty 90, 90d supply, fill #1

## 2022-11-05 MED ORDER — SERTRALINE HCL 50 MG PO TABS
50.0000 mg | ORAL_TABLET | Freq: Every day | ORAL | 1 refills | Status: DC
  Filled 2022-11-05: qty 90, 90d supply, fill #0
  Filled 2023-05-18: qty 90, 90d supply, fill #1

## 2022-11-06 ENCOUNTER — Other Ambulatory Visit: Payer: Self-pay

## 2023-01-09 ENCOUNTER — Encounter: Payer: Self-pay | Admitting: Family

## 2023-02-09 ENCOUNTER — Other Ambulatory Visit (HOSPITAL_COMMUNITY): Payer: Self-pay

## 2023-02-09 ENCOUNTER — Other Ambulatory Visit: Payer: Self-pay | Admitting: Family

## 2023-02-09 MED ORDER — AMLODIPINE BESYLATE 10 MG PO TABS
10.0000 mg | ORAL_TABLET | Freq: Every day | ORAL | 0 refills | Status: DC
  Filled 2023-02-09: qty 90, 90d supply, fill #0

## 2023-03-09 ENCOUNTER — Encounter: Payer: Self-pay | Admitting: Family

## 2023-05-18 ENCOUNTER — Encounter: Payer: Self-pay | Admitting: Family

## 2023-05-18 ENCOUNTER — Other Ambulatory Visit: Payer: Self-pay | Admitting: Family Medicine

## 2023-05-18 ENCOUNTER — Other Ambulatory Visit: Payer: Self-pay | Admitting: Family

## 2023-05-18 ENCOUNTER — Other Ambulatory Visit (HOSPITAL_COMMUNITY): Payer: Self-pay

## 2023-05-18 ENCOUNTER — Other Ambulatory Visit: Payer: Self-pay

## 2023-05-18 DIAGNOSIS — E789 Disorder of lipoprotein metabolism, unspecified: Secondary | ICD-10-CM

## 2023-05-18 MED ORDER — EZETIMIBE 10 MG PO TABS
10.0000 mg | ORAL_TABLET | Freq: Every day | ORAL | 0 refills | Status: DC
Start: 2023-05-18 — End: 2023-07-01
  Filled 2023-05-18: qty 30, 30d supply, fill #0

## 2023-05-18 MED ORDER — ROSUVASTATIN CALCIUM 40 MG PO TABS
40.0000 mg | ORAL_TABLET | Freq: Every day | ORAL | 0 refills | Status: DC
Start: 2023-05-18 — End: 2023-07-01
  Filled 2023-05-18: qty 30, 30d supply, fill #0

## 2023-05-18 MED ORDER — AMLODIPINE BESYLATE 10 MG PO TABS
10.0000 mg | ORAL_TABLET | Freq: Every day | ORAL | 0 refills | Status: DC
  Filled 2023-05-18: qty 30, 30d supply, fill #0

## 2023-07-01 ENCOUNTER — Encounter: Payer: Self-pay | Admitting: Family

## 2023-07-01 ENCOUNTER — Other Ambulatory Visit (HOSPITAL_COMMUNITY): Payer: Self-pay

## 2023-07-01 ENCOUNTER — Other Ambulatory Visit: Payer: Self-pay | Admitting: Family

## 2023-07-01 DIAGNOSIS — E789 Disorder of lipoprotein metabolism, unspecified: Secondary | ICD-10-CM

## 2023-07-01 MED ORDER — ROSUVASTATIN CALCIUM 40 MG PO TABS
40.0000 mg | ORAL_TABLET | Freq: Every day | ORAL | 0 refills | Status: DC
Start: 2023-07-01 — End: 2023-07-03
  Filled 2023-07-01: qty 30, 30d supply, fill #0

## 2023-07-01 MED ORDER — AMLODIPINE BESYLATE 10 MG PO TABS
10.0000 mg | ORAL_TABLET | Freq: Every day | ORAL | 0 refills | Status: DC
  Filled 2023-07-01: qty 30, 30d supply, fill #0

## 2023-07-01 MED ORDER — EZETIMIBE 10 MG PO TABS
10.0000 mg | ORAL_TABLET | Freq: Every day | ORAL | 0 refills | Status: DC
Start: 2023-07-01 — End: 2023-07-03
  Filled 2023-07-01: qty 30, 30d supply, fill #0

## 2023-07-03 ENCOUNTER — Other Ambulatory Visit (HOSPITAL_COMMUNITY): Payer: Self-pay

## 2023-07-03 ENCOUNTER — Ambulatory Visit (INDEPENDENT_AMBULATORY_CARE_PROVIDER_SITE_OTHER): Payer: 59 | Admitting: Family

## 2023-07-03 VITALS — BP 132/75 | HR 88 | Temp 98.0°F | Resp 16 | Ht 70.0 in | Wt 187.8 lb

## 2023-07-03 DIAGNOSIS — L309 Dermatitis, unspecified: Secondary | ICD-10-CM | POA: Diagnosis not present

## 2023-07-03 DIAGNOSIS — Z23 Encounter for immunization: Secondary | ICD-10-CM | POA: Diagnosis not present

## 2023-07-03 DIAGNOSIS — E785 Hyperlipidemia, unspecified: Secondary | ICD-10-CM | POA: Diagnosis not present

## 2023-07-03 DIAGNOSIS — R972 Elevated prostate specific antigen [PSA]: Secondary | ICD-10-CM | POA: Diagnosis not present

## 2023-07-03 DIAGNOSIS — F411 Generalized anxiety disorder: Secondary | ICD-10-CM | POA: Diagnosis not present

## 2023-07-03 DIAGNOSIS — I10 Essential (primary) hypertension: Secondary | ICD-10-CM | POA: Diagnosis not present

## 2023-07-03 DIAGNOSIS — L209 Atopic dermatitis, unspecified: Secondary | ICD-10-CM

## 2023-07-03 DIAGNOSIS — E789 Disorder of lipoprotein metabolism, unspecified: Secondary | ICD-10-CM

## 2023-07-03 LAB — LDL CHOLESTEROL, DIRECT: Direct LDL: 223 mg/dL

## 2023-07-03 LAB — LIPID PANEL
Cholesterol: 312 mg/dL — ABNORMAL HIGH (ref 0–200)
HDL: 54.4 mg/dL (ref >39.00)
NonHDL: 257.41
Total CHOL/HDL Ratio: 6
Triglycerides: 243 mg/dL — ABNORMAL HIGH (ref 0.0–149.0)
VLDL: 48.6 mg/dL — ABNORMAL HIGH (ref 0.0–40.0)

## 2023-07-03 LAB — BASIC METABOLIC PANEL
BUN: 18 mg/dL (ref 6–23)
CO2: 25 mEq/L (ref 19–32)
Calcium: 9.7 mg/dL (ref 8.4–10.5)
Chloride: 104 mEq/L (ref 96–112)
Creatinine, Ser: 0.81 mg/dL (ref 0.40–1.50)
GFR: 93.72 mL/min (ref >60.00)
Glucose, Bld: 105 mg/dL — ABNORMAL HIGH (ref 70–99)
Potassium: 4.4 mEq/L (ref 3.5–5.1)
Sodium: 139 mEq/L (ref 135–145)

## 2023-07-03 MED ORDER — EZETIMIBE 10 MG PO TABS
10.0000 mg | ORAL_TABLET | Freq: Every day | ORAL | 1 refills | Status: DC
Start: 2023-07-03 — End: 2024-02-02
  Filled 2023-07-03 – 2023-08-02 (×2): qty 90, 90d supply, fill #0
  Filled 2023-11-02: qty 90, 90d supply, fill #1

## 2023-07-03 MED ORDER — ROSUVASTATIN CALCIUM 40 MG PO TABS
40.0000 mg | ORAL_TABLET | Freq: Every day | ORAL | 1 refills | Status: DC
  Filled 2023-07-03 – 2023-08-02 (×2): qty 90, 90d supply, fill #0
  Filled 2023-11-02: qty 90, 90d supply, fill #1

## 2023-07-03 MED ORDER — SERTRALINE HCL 50 MG PO TABS
50.0000 mg | ORAL_TABLET | Freq: Every day | ORAL | 1 refills | Status: DC
  Filled 2023-07-03 – 2023-09-14 (×2): qty 90, 90d supply, fill #0
  Filled 2023-12-15: qty 90, 90d supply, fill #1

## 2023-07-03 MED ORDER — TRIAMCINOLONE ACETONIDE 0.1 % EX CREA
TOPICAL_CREAM | Freq: Every day | CUTANEOUS | 1 refills | Status: AC
Start: 2023-07-03 — End: ?
  Filled 2023-07-03: qty 80, 30d supply, fill #0

## 2023-07-03 MED ORDER — AMLODIPINE BESYLATE 10 MG PO TABS
10.0000 mg | ORAL_TABLET | Freq: Every day | ORAL | 1 refills | Status: DC
  Filled 2023-07-03 – 2023-08-02 (×2): qty 90, 90d supply, fill #0
  Filled 2023-11-02: qty 90, 90d supply, fill #1

## 2023-07-03 NOTE — Patient Instructions (Signed)
VISIT SUMMARY:  During your recent visit, we discussed your hypertension, hyperlipidemia, anxiety, and atopic dermatitis. We also talked about your general health maintenance, including vaccinations and upcoming screenings.  YOUR PLAN:  -HYPERTENSION: Your blood pressure was a bit high. You've restarted your amlodipine medication, which should help bring it down. Hypertension is a condition where the force of the blood against the artery walls is too high.  -HYPERLIPIDEMIA: Your cholesterol levels are slightly elevated. You should continue taking your current medications, rosuvastatin and ezetimibe. Hyperlipidemia is a condition where there are high levels of fats (lipids) in the blood.  -ANXIETY: You've been feeling more stressed due to work, but you're managing it well with regular exercise. Anxiety is a feeling of worry, nervousness, or unease about something with an uncertain outcome.  -ATOPIC DERMATITIS: Your seasonal rash, diagnosed as atopic dermatitis, responds well to triamcinolone. Atopic dermatitis is a condition that makes your skin red and itchy.  -GENERAL HEALTH MAINTENANCE: You've declined the flu shot and have concerns about the shingles vaccine. You're due for a colonoscopy next year and should consider getting the COVID-19 booster shot. Also, remember that your physical is due in December.  INSTRUCTIONS:  Please continue taking your medications as prescribed. Check your blood pressure regularly and report any significant changes. Continue your exercise routine to manage your anxiety. Use triamcinolone for your rash as needed. Consider getting your vaccinations and remember to schedule your physical in December. We'll check your cholesterol and kidney function today, and we'll see you again in 6 months.

## 2023-07-03 NOTE — Assessment & Plan Note (Signed)
Initial reading elevated here today, but repeat BP WNL. Continue amlodipine 10mg  nightly. -Recheck blood pressure in 6 months.

## 2023-07-03 NOTE — Assessment & Plan Note (Signed)
   Seasonal rash, responsive to triamcinolone. -Prescribe triamcinolone for flare-ups. -Consider alternative treatment (lotrisone) if triamcinolone does not clear rash.

## 2023-07-03 NOTE — Assessment & Plan Note (Signed)
Total cholesterol mildly elevated at 206, with mildly elevated triglycerides. Patient is currently on rosuvastatin and ezetimibe. -Continue rosuvastatin and ezetimibe. -Check lipid panel and kidney function today.

## 2023-07-03 NOTE — Progress Notes (Signed)
Subjective:     Patient ID: Timothy Hoffman, male    DOB: 1961-04-27, 62 y.o.   MRN: [id number]  Chief Complaint  Patient presents with   Medication Refill    Medication refill    HPI  Discussed the use of AI scribe software for clinical note transcription with the patient, who gave verbal consent to proceed.  History of Present Illness   The patient, with a history of hypertension and hyperlipidemia, presents for a follow-up visit. He reports that his blood pressure was elevated at 143/85 when checked at home, off medication on Wednesday.  States he ran out of medication but just restarted amlodipine 10mg  last night. The patient also mentions that he is on rosuvastatin and ezetimibe for cholesterol management.  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 He manages his anxiety through regular exercise and sertraline.  Additionally, the patient experiences a seasonal rash on his hands, diagnosed as atopic dermatitis, which responds well to triamcinolone. He requests a refill of triamcinolone. He also reports itchy eyes, which he attributes to allergies and manages with loratadine.  The patient is interested in receiving a flu shot and has been putting off receiving the shingles vaccine due to concerns about side effects. He is also due for a colonoscopy in a year and is advised to consider getting the COVID booster shot at the pharmacy.  The patient has been following up with [facility] for his PSA levels, which have been clear on retest. He also mentions a previous concern about a high PSA level that was attributed to illness.       Lab Results  Component Value Date   CHOL 206 (H) 10/06/2022   HDL 67.90 10/06/2022   LDLCALC 100 (H) 10/06/2022   LDLDIRECT 114.0 05/10/2020   TRIG 189.0 (H) 10/06/2022   CHOLHDL 3 10/06/2022      Health Maintenance Due  Topic Date Due   Zoster  Vaccines- Shingrix (1 of 2) Never done   COVID-19 Vaccine (4 - 2023-24 season) 07/04/2022    Past Medical History:  Diagnosis Date   Allergy    SEASONAL   Anemia    IV iron x3 05/2019   Anxiety    Arrhythmia 1999   Atopic dermatitis    Diverticulosis    History of colon polyps    Hyperlipemia    Internal hemorrhoid, bleeding 2013   PONV (postoperative nausea and vomiting)     Past Surgical History:  Procedure Laterality Date   COLONOSCOPY     EVALUATION UNDER ANESTHESIA WITH HEMORRHOIDECTOMY N/A 07/07/2019   Procedure: HEMORRHOIDECTOMY, HEMORRHOIDAL LIGATION/PEXY ANORECTAL EXAM UNDER ANESTHESIA;  Surgeon: Karie Soda, MD;  Location: [facility];  Service: General;  Laterality: N/A;  LOCAL   KNEE ARTHROSCOPY Right 2012   Meniscal repair   POLYPECTOMY     Right shoulder scope     WISDOM TOOTH EXTRACTION      Family History  Problem Relation Age of Onset   Lymphoma Father    Cancer Father        nonHodgkin's lymphoma   Hyperlipidemia Mother    Hypertension Mother    Hypertension Brother    Hyperlipidemia Brother    Mental illness Son        Anxiety   Anxiety disorder Son    Stroke Maternal Grandmother    Colon cancer Neg Hx    Colon polyps Neg  Hx    Esophageal cancer Neg Hx    Rectal cancer Neg Hx    Stomach cancer Neg Hx     Social History   Socioeconomic History   Marital status: Married    Spouse name: Misty Stanley   Number of children: 2   Years of education: college   Highest education level: Bachelor's degree (e.g., BA, AB, BS)  Occupational History   Occupation: Audiological scientist: [employer]  Tobacco Use   Smoking status: Never   Smokeless tobacco: Never   Tobacco comments:    20 yrs ago   Vaping Use   Vaping status: Never Used  Substance and Sexual Activity   Alcohol use: Yes    Alcohol/week: 2.0 standard drinks of alcohol    Types: 2 Cans of beer per week   Drug use: No   Sexual activity: Yes    Partners: Female  Other Topics Concern    Not on file  Social History Narrative   Lives with his wife and son. Daughter is out of the house   His wife is an Charity fundraiser.    Their son has significant anxiety.   He exercises by running 3-5 miles 4x/week.   Vegan   Social Determinants of Health   Financial Resource Strain: Low Risk  (07/03/2023)   Overall Financial Resource Strain (CARDIA)    Difficulty of Paying Living Expenses: Not hard at all  Food Insecurity: No Food Insecurity (07/03/2023)   Hunger Vital Sign    Worried About Running Out of Food in the Last Year: Never true    Ran Out of Food in the Last Year: Never true  Transportation Needs: No Transportation Needs (07/03/2023)   PRAPARE - Administrator, Civil Service (Medical): No    Lack of Transportation (Non-Medical): No  Physical Activity: Sufficiently Active (07/03/2023)   Exercise Vital Sign    Days of Exercise per Week: 7 days    Minutes of Exercise per Session: 90 min  Stress: Stress Concern Present (07/03/2023)   [employer] of Occupational Health - Occupational Stress Questionnaire    Feeling of Stress : To some extent  Social Connections: Moderately Isolated (07/03/2023)   Social Connection and Isolation Panel [NHANES]    Frequency of Communication with Friends and Family: More than three times a week    Frequency of Social Gatherings with Friends and Family: Three times a week    Attends Religious Services: Never    Active Member of Clubs or Organizations: No    Attends Banker Meetings: Not on file    Marital Status: Married  Catering manager Violence: Not At Risk (09/06/2018)   Humiliation, Afraid, Rape, and Kick questionnaire    Fear of Current or Ex-Partner: No    Emotionally Abused: No    Physically Abused: No    Sexually Abused: No    Outpatient Medications Prior to Visit  Medication Sig Dispense Refill   hydrocortisone cream 1 % Apply 1 application topically daily as needed (poison ivy).     loratadine (CLARITIN) 10 MG  tablet Take 10 mg by mouth daily as needed for allergies.     PREVIDENT 0.2 % SOLN Take by mouth as directed.     amLODipine (NORVASC) 10 MG tablet Take 1 tablet (10 mg total) by mouth daily. 30 tablet 0   ezetimibe (ZETIA) 10 MG tablet Take 1 tablet (10 mg total) by mouth daily. 30 tablet 0   rosuvastatin (CRESTOR) 40  MG tablet Take 1 tablet (40 mg total) by mouth daily. 30 tablet 0   sertraline (ZOLOFT) 50 MG tablet Take 1 tablet (50 mg total) by mouth daily. 90 tablet 1   triamcinolone cream (KENALOG) 0.1 % Apply topically daily. 80 g 1   sildenafil (REVATIO) 20 MG tablet Take 3 tablets by mouth as needed.     No facility-administered medications prior to visit.    Allergies  Allergen Reactions   Rose Hips [Ascorbate] Itching and Rash   Tequin Rash    ROS     Objective:    Physical Exam Constitutional:      General: He is not in acute distress.    Appearance: He is well-developed.  HENT:     Head: Normocephalic and atraumatic.  Cardiovascular:     Rate and Rhythm: Normal rate and regular rhythm.     Heart sounds: No murmur heard. Pulmonary:     Effort: Pulmonary effort is normal. No respiratory distress.     Breath sounds: Normal breath sounds. No wheezing or rales.  Skin:    General: Skin is warm and dry.     Comments: Dry peeling rash noted bilateral hands/fingers R>L  Neurological:     Mental Status: He is alert and oriented to person, place, and time.  Psychiatric:        Behavior: Behavior normal.        Thought Content: Thought content normal.      BP 132/75   Pulse 88   Temp 98 F (36.7 C) (Oral)   Resp 16   Ht 5\' 10"  (1.778 m)   Wt 187 lb 12.8 oz (85.2 kg)   SpO2 99%   BMI 26.95 kg/m  Wt Readings from Last 3 Encounters:  07/03/23 187 lb 12.8 oz (85.2 kg)  10/06/22 187 lb (84.8 kg)  05/13/22 182 lb (82.6 kg)       Assessment & Plan:   Problem List Items Addressed This Visit       Unprioritized   Generalized anxiety disorder (Chronic)      Patient reports increased stress due to work but manages well with exercise. -Continue current management strategies and sertraline.       Relevant Medications   sertraline (ZOLOFT) 50 MG tablet   PSA elevation    Lab Results  Component Value Date   PSA1 1.9 07/24/2017   PSA 1.73 10/06/2022   PSA 2.24 09/06/2018   PSA 2.0 06/24/2016    Previously followed by [facility], but follow up levels have been WNL.  Plan to recheck at CPE.       Lipid disorder    Total cholesterol mildly elevated at 206, with mildly elevated triglycerides. Patient is currently on rosuvastatin and ezetimibe. -Continue rosuvastatin and ezetimibe. -Check lipid panel and kidney function today.      Relevant Medications   rosuvastatin (CRESTOR) 40 MG tablet   ezetimibe (ZETIA) 10 MG tablet   Hypertension    Initial reading elevated here today, but repeat BP WNL. Continue amlodipine 10mg  nightly. -Recheck blood pressure in 6 months.       Relevant Medications   rosuvastatin (CRESTOR) 40 MG tablet   ezetimibe (ZETIA) 10 MG tablet   amLODipine (NORVASC) 10 MG tablet   Other Relevant Orders   Basic Metabolic Panel (BMET)   Dermatitis      Seasonal rash, responsive to triamcinolone. -Prescribe triamcinolone for flare-ups. -Consider alternative treatment (lotrisone) if triamcinolone does not clear rash.  Other Visit Diagnoses     Hyperlipidemia, unspecified hyperlipidemia type    -  Primary   Relevant Medications   rosuvastatin (CRESTOR) 40 MG tablet   ezetimibe (ZETIA) 10 MG tablet   amLODipine (NORVASC) 10 MG tablet   Other Relevant Orders   Lipid panel   Atopic dermatitis, unspecified type       Relevant Medications   triamcinolone cream (KENALOG) 0.1 %   Need for influenza vaccination       Relevant Orders   Flu vaccine trivalent PF, 6mos and older(Flulaval,Afluria,Fluarix,Fluzone) (Completed)       I have discontinued Mazin H. Gibby's sildenafil. I am also having him maintain  his loratadine, hydrocortisone cream, PreviDent, triamcinolone cream, rosuvastatin, ezetimibe, sertraline, and amLODipine.  Meds ordered this encounter  Medications   triamcinolone cream (KENALOG) 0.1 %    Sig: Apply topically daily.    Dispense:  80 g    Refill:  1    Order Specific Question:   Supervising Provider    Answer:   Danise Edge A [[id number]]   rosuvastatin (CRESTOR) 40 MG tablet    Sig: Take 1 tablet (40 mg total) by mouth daily.    Dispense:  90 tablet    Refill:  1    Order Specific Question:   Supervising Provider    Answer:   Danise Edge A [[id number]]   ezetimibe (ZETIA) 10 MG tablet    Sig: Take 1 tablet (10 mg total) by mouth daily.    Dispense:  90 tablet    Refill:  1    Order Specific Question:   Supervising Provider    Answer:   Danise Edge A [[id number]]   sertraline (ZOLOFT) 50 MG tablet    Sig: Take 1 tablet (50 mg total) by mouth daily.    Dispense:  90 tablet    Refill:  1    Order Specific Question:   Supervising Provider    Answer:   Danise Edge A [[id number]]   amLODipine (NORVASC) 10 MG tablet    Sig: Take 1 tablet (10 mg total) by mouth daily.    Dispense:  90 tablet    Refill:  1    Order Specific Question:   Supervising Provider    Answer:   Danise Edge A [[id number]]

## 2023-07-03 NOTE — Assessment & Plan Note (Signed)
Lab Results  Component Value Date   PSA1 1.9 07/24/2017   PSA 1.73 10/06/2022   PSA 2.24 09/06/2018   PSA 2.0 06/24/2016    Previously followed by [facility], but follow up levels have been WNL.  Plan to recheck at CPE.

## 2023-07-03 NOTE — Assessment & Plan Note (Signed)
  Patient reports increased stress due to work but manages well with exercise. -Continue current management strategies and sertraline.

## 2023-07-07 ENCOUNTER — Encounter: Payer: Self-pay | Admitting: Family

## 2023-07-17 ENCOUNTER — Encounter: Payer: Self-pay | Admitting: Family

## 2023-07-21 ENCOUNTER — Encounter: Payer: Self-pay | Admitting: Family

## 2023-07-21 NOTE — Telephone Encounter (Signed)
OK to schedule nurse visit for shigles shot please.

## 2023-08-03 ENCOUNTER — Other Ambulatory Visit: Payer: Self-pay

## 2023-08-03 ENCOUNTER — Other Ambulatory Visit (HOSPITAL_COMMUNITY): Payer: Self-pay

## 2023-08-11 ENCOUNTER — Encounter: Payer: Self-pay | Admitting: Family

## 2023-09-14 ENCOUNTER — Other Ambulatory Visit (HOSPITAL_COMMUNITY): Payer: Self-pay

## 2023-10-21 ENCOUNTER — Other Ambulatory Visit (HOSPITAL_COMMUNITY): Payer: Self-pay

## 2023-10-21 MED ORDER — CHLORHEXIDINE GLUCONATE 0.12 % MT SOLN
Freq: Two times a day (BID) | OROMUCOSAL | 0 refills | Status: DC
  Filled 2023-10-21: qty 473, 16d supply, fill #0
  Filled 2023-11-02: qty 473, 15d supply, fill #0

## 2023-11-02 ENCOUNTER — Other Ambulatory Visit (HOSPITAL_COMMUNITY): Payer: Self-pay

## 2023-12-15 ENCOUNTER — Encounter: Payer: Self-pay | Admitting: Family

## 2023-12-15 ENCOUNTER — Other Ambulatory Visit (HOSPITAL_COMMUNITY): Payer: Self-pay

## 2024-02-02 ENCOUNTER — Other Ambulatory Visit (HOSPITAL_COMMUNITY): Payer: Self-pay

## 2024-02-02 ENCOUNTER — Other Ambulatory Visit: Payer: Self-pay | Admitting: Family

## 2024-02-02 DIAGNOSIS — E789 Disorder of lipoprotein metabolism, unspecified: Secondary | ICD-10-CM

## 2024-02-02 DIAGNOSIS — I10 Essential (primary) hypertension: Secondary | ICD-10-CM

## 2024-02-02 MED ORDER — AMLODIPINE BESYLATE 10 MG PO TABS
10.0000 mg | ORAL_TABLET | Freq: Every day | ORAL | 0 refills | Status: DC
  Filled 2024-02-02: qty 30, 30d supply, fill #0

## 2024-02-02 MED ORDER — ROSUVASTATIN CALCIUM 40 MG PO TABS
40.0000 mg | ORAL_TABLET | Freq: Every day | ORAL | 0 refills | Status: DC
  Filled 2024-02-02: qty 30, 30d supply, fill #0

## 2024-02-02 MED ORDER — EZETIMIBE 10 MG PO TABS
10.0000 mg | ORAL_TABLET | Freq: Every day | ORAL | 0 refills | Status: DC
Start: 2024-02-02 — End: 2024-02-29
  Filled 2024-02-02: qty 30, 30d supply, fill #0

## 2024-02-29 ENCOUNTER — Other Ambulatory Visit (HOSPITAL_COMMUNITY): Payer: Self-pay

## 2024-02-29 ENCOUNTER — Other Ambulatory Visit: Payer: Self-pay | Admitting: Family Medicine

## 2024-02-29 ENCOUNTER — Encounter: Payer: Self-pay | Admitting: Family

## 2024-02-29 DIAGNOSIS — I10 Essential (primary) hypertension: Secondary | ICD-10-CM

## 2024-02-29 DIAGNOSIS — E789 Disorder of lipoprotein metabolism, unspecified: Secondary | ICD-10-CM

## 2024-02-29 DIAGNOSIS — E785 Hyperlipidemia, unspecified: Secondary | ICD-10-CM

## 2024-02-29 MED ORDER — ROSUVASTATIN CALCIUM 40 MG PO TABS
40.0000 mg | ORAL_TABLET | Freq: Every day | ORAL | 0 refills | Status: DC
Start: 2024-02-29 — End: 2024-04-04
  Filled 2024-02-29: qty 30, 30d supply, fill #0

## 2024-02-29 MED ORDER — EZETIMIBE 10 MG PO TABS
10.0000 mg | ORAL_TABLET | Freq: Every day | ORAL | 0 refills | Status: DC
  Filled 2024-02-29: qty 30, 30d supply, fill #0

## 2024-02-29 MED ORDER — AMLODIPINE BESYLATE 10 MG PO TABS
10.0000 mg | ORAL_TABLET | Freq: Every day | ORAL | 0 refills | Status: DC
  Filled 2024-02-29: qty 30, 30d supply, fill #0

## 2024-03-04 ENCOUNTER — Encounter: Payer: Self-pay | Admitting: Family

## 2024-03-07 ENCOUNTER — Encounter: Payer: Self-pay | Admitting: Family

## 2024-03-07 ENCOUNTER — Other Ambulatory Visit (HOSPITAL_BASED_OUTPATIENT_CLINIC_OR_DEPARTMENT_OTHER): Payer: Self-pay

## 2024-03-07 ENCOUNTER — Ambulatory Visit (HOSPITAL_BASED_OUTPATIENT_CLINIC_OR_DEPARTMENT_OTHER)
Admission: RE | Admit: 2024-03-07 | Discharge: 2024-03-07 | Disposition: A | Discharge status: Home / Self Care | Payer: Self-pay | Source: Ambulatory Visit | Attending: Family | Admitting: Family

## 2024-03-07 DIAGNOSIS — E785 Hyperlipidemia, unspecified: Secondary | ICD-10-CM | POA: Insufficient documentation

## 2024-03-07 MED ORDER — SHINGRIX 50 MCG/0.5ML IM SUSR
0.5000 mL | Freq: Once | INTRAMUSCULAR | 1 refills | Status: AC
  Filled 2024-03-07: qty 0.5, 1d supply, fill #0

## 2024-03-09 ENCOUNTER — Telehealth: Payer: Self-pay | Admitting: Family

## 2024-03-09 DIAGNOSIS — I2510 Atherosclerotic heart disease of native coronary artery without angina pectoris: Secondary | ICD-10-CM

## 2024-03-09 NOTE — Telephone Encounter (Signed)
 Pt returning call

## 2024-03-09 NOTE — Telephone Encounter (Signed)
 Called patient but no answer, left voice mail for patient to call back.

## 2024-03-09 NOTE — Telephone Encounter (Signed)
 Patient made aware of results, referral and provider's comments.

## 2024-03-09 NOTE — Telephone Encounter (Signed)
 Please advise pt that his calcium  score is very high on his cardiac CT.  I would like to refer him to cardiology for further evaluation.  Make sure he is taking crestor  and zetia  every day as well.

## 2024-03-14 ENCOUNTER — Other Ambulatory Visit (HOSPITAL_COMMUNITY): Payer: Self-pay

## 2024-03-14 ENCOUNTER — Other Ambulatory Visit: Payer: Self-pay | Admitting: Family

## 2024-03-14 DIAGNOSIS — F411 Generalized anxiety disorder: Secondary | ICD-10-CM

## 2024-03-14 MED ORDER — SERTRALINE HCL 50 MG PO TABS
50.0000 mg | ORAL_TABLET | Freq: Every day | ORAL | 0 refills | Status: DC
  Filled 2024-03-14: qty 30, 30d supply, fill #0

## 2024-03-30 ENCOUNTER — Other Ambulatory Visit: Payer: Self-pay | Admitting: Family Medicine

## 2024-03-30 DIAGNOSIS — E789 Disorder of lipoprotein metabolism, unspecified: Secondary | ICD-10-CM

## 2024-03-30 DIAGNOSIS — I10 Essential (primary) hypertension: Secondary | ICD-10-CM

## 2024-04-02 ENCOUNTER — Other Ambulatory Visit: Payer: Self-pay | Admitting: Family Medicine

## 2024-04-02 DIAGNOSIS — E789 Disorder of lipoprotein metabolism, unspecified: Secondary | ICD-10-CM

## 2024-04-02 DIAGNOSIS — I10 Essential (primary) hypertension: Secondary | ICD-10-CM

## 2024-04-04 ENCOUNTER — Encounter: Payer: Self-pay | Admitting: Family

## 2024-04-04 ENCOUNTER — Other Ambulatory Visit (HOSPITAL_COMMUNITY): Payer: Self-pay

## 2024-04-04 DIAGNOSIS — E789 Disorder of lipoprotein metabolism, unspecified: Secondary | ICD-10-CM

## 2024-04-04 DIAGNOSIS — F411 Generalized anxiety disorder: Secondary | ICD-10-CM

## 2024-04-04 DIAGNOSIS — I10 Essential (primary) hypertension: Secondary | ICD-10-CM

## 2024-04-04 MED ORDER — EZETIMIBE 10 MG PO TABS
10.0000 mg | ORAL_TABLET | Freq: Every day | ORAL | 0 refills | Status: DC
  Filled 2024-04-04: qty 30, 30d supply, fill #0

## 2024-04-04 MED ORDER — AMLODIPINE BESYLATE 10 MG PO TABS
10.0000 mg | ORAL_TABLET | Freq: Every day | ORAL | 0 refills | Status: DC
  Filled 2024-04-04: qty 30, 30d supply, fill #0

## 2024-04-04 MED ORDER — ROSUVASTATIN CALCIUM 40 MG PO TABS
40.0000 mg | ORAL_TABLET | Freq: Every day | ORAL | 0 refills | Status: DC
  Filled 2024-04-04: qty 30, 30d supply, fill #0

## 2024-04-04 MED ORDER — SERTRALINE HCL 50 MG PO TABS
50.0000 mg | ORAL_TABLET | Freq: Every day | ORAL | 0 refills | Status: DC
  Filled 2024-04-04 – 2024-04-09 (×2): qty 30, 30d supply, fill #0

## 2024-04-05 ENCOUNTER — Other Ambulatory Visit (HOSPITAL_COMMUNITY): Payer: Self-pay

## 2024-04-07 ENCOUNTER — Other Ambulatory Visit (HOSPITAL_COMMUNITY): Payer: Self-pay

## 2024-04-09 ENCOUNTER — Other Ambulatory Visit (HOSPITAL_COMMUNITY): Payer: Self-pay

## 2024-04-11 ENCOUNTER — Other Ambulatory Visit (HOSPITAL_COMMUNITY): Payer: Self-pay

## 2024-04-11 MED ORDER — COVID-19 MRNA VAC-TRIS(PFIZER) 30 MCG/0.3ML IM SUSY
PREFILLED_SYRINGE | INTRAMUSCULAR | 0 refills | Status: DC
  Filled 2024-04-11: qty 0.3, 1d supply, fill #0

## 2024-05-09 ENCOUNTER — Other Ambulatory Visit (HOSPITAL_COMMUNITY): Payer: Self-pay

## 2024-05-09 ENCOUNTER — Other Ambulatory Visit: Payer: Self-pay | Admitting: Family Medicine

## 2024-05-09 ENCOUNTER — Encounter (HOSPITAL_COMMUNITY): Payer: Self-pay

## 2024-05-09 ENCOUNTER — Encounter: Payer: Self-pay | Admitting: Family

## 2024-05-09 DIAGNOSIS — E789 Disorder of lipoprotein metabolism, unspecified: Secondary | ICD-10-CM

## 2024-05-09 DIAGNOSIS — I10 Essential (primary) hypertension: Secondary | ICD-10-CM

## 2024-05-09 DIAGNOSIS — F411 Generalized anxiety disorder: Secondary | ICD-10-CM

## 2024-05-09 MED ORDER — ROSUVASTATIN CALCIUM 40 MG PO TABS
40.0000 mg | ORAL_TABLET | Freq: Every day | ORAL | 0 refills | Status: DC
Start: 2024-05-09 — End: 2024-06-01
  Filled 2024-05-09: qty 30, 30d supply, fill #0

## 2024-05-09 MED ORDER — EZETIMIBE 10 MG PO TABS
10.0000 mg | ORAL_TABLET | Freq: Every day | ORAL | 0 refills | Status: DC
  Filled 2024-05-09: qty 30, 30d supply, fill #0

## 2024-05-11 DIAGNOSIS — Z0000 Encounter for general adult medical examination without abnormal findings: Secondary | ICD-10-CM | POA: Insufficient documentation

## 2024-05-11 NOTE — Assessment & Plan Note (Signed)
 Stable on amlodipine  10 mg.Well controlled, no changes to meds. Encouraged heart healthy diet such as the DASH diet and exercise as tolerated.

## 2024-05-11 NOTE — Assessment & Plan Note (Signed)
 Continue current management strategies and sertraline .

## 2024-05-11 NOTE — Assessment & Plan Note (Signed)
 Last colonoscopy 2022.  Recommendation per GI repeat 3 to 5 years. Pt states he will set up appointment for this.

## 2024-05-11 NOTE — Assessment & Plan Note (Signed)
 Stable on ezetimibe  and rosuvastatin . Encourage heart healthy diet such as MIND or DASH diet, increase exercise, avoid trans fats, simple carbohydrates and processed foods, consider a krill or fish or flaxseed oil cap daily.

## 2024-05-11 NOTE — Assessment & Plan Note (Signed)
Patient encouraged to maintain heart healthy diet, regular exercise, adequate sleep. Consider daily probiotics. Take medications as prescribed. Labs ordered and reviewed 

## 2024-05-11 NOTE — Progress Notes (Unsigned)
 Subjective:     Patient ID: Timothy Hoffman, male    DOB: Jul 25, 1961, 63 y.o.   MRN: [id number]  No chief complaint on file.   HPI    History of Present Illness        Timothy Hoffman is a 63 year old male presents for follow-up chronic conditions.   Hypertension Amlodipine  10 mg daily  HLD Ezetimibe  10 mg daily Rosuvastatin  40 mg daily  GAD Sertraline  50 mg daily  HCM: - Colonoscopy: High risk r/t related Hx of 3+ adenomas.+Diverticulosis. Last colonoscopy 2022, recommend repeat colonoscopy 3 to 5 years. - Immunizations: -AAA US : No hx Smoking    Patient reports compliance with medications.  Patient denies fever, chills, SOB, CP, palpitations, dyspnea, edema, HA, vision changes, N/V/D, abdominal pain, urinary symptoms, rash, weight changes, and recent illness or hospitalizations.       Health Maintenance Due  Topic Date Due   Pneumococcal Vaccine 33-72 Years old (1 of 2 - PCV) Never done   Colonoscopy  02/07/2024   Zoster Vaccines- Shingrix  (2 of 2) 05/02/2024    Past Medical History:  Diagnosis Date   Allergy    SEASONAL   Anemia    IV iron x3 05/2019   Anxiety    Arrhythmia 1999   Atopic dermatitis    Diverticulosis    History of colon polyps    Hyperlipemia    Internal hemorrhoid, bleeding 2013   PONV (postoperative nausea and vomiting)     Past Surgical History:  Procedure Laterality Date   COLONOSCOPY     EVALUATION UNDER ANESTHESIA WITH HEMORRHOIDECTOMY N/A 07/07/2019   Procedure: HEMORRHOIDECTOMY, HEMORRHOIDAL LIGATION/PEXY ANORECTAL EXAM UNDER ANESTHESIA;  Surgeon: Sheldon Standing, MD;  Location: [facility];  Service: General;  Laterality: N/A;  LOCAL   KNEE ARTHROSCOPY Right 2012   Meniscal repair   POLYPECTOMY     Right shoulder scope     WISDOM TOOTH EXTRACTION      Family History  Problem Relation Age of Onset   Lymphoma Father    Cancer Father        nonHodgkin's lymphoma   Hyperlipidemia Mother    Hypertension Mother     Hypertension Brother    Hyperlipidemia Brother    Mental illness Son        Anxiety   Anxiety disorder Son    Stroke Maternal Grandmother    Colon cancer Neg Hx    Colon polyps Neg Hx    Esophageal cancer Neg Hx    Rectal cancer Neg Hx    Stomach cancer Neg Hx     Social History   Socioeconomic History   Marital status: Married    Spouse name: Harlene   Number of children: 2   Years of education: college   Highest education level: Bachelor's degree (e.g., BA, AB, BS)  Occupational History   Occupation: Audiological scientist: [employer]  Tobacco Use   Smoking status: Never   Smokeless tobacco: Never   Tobacco comments:    20 yrs ago   Vaping Use   Vaping status: Never Used  Substance and Sexual Activity   Alcohol use: Yes    Alcohol/week: 2.0 standard drinks of alcohol    Types: 2 Cans of beer per week   Drug use: No   Sexual activity: Yes    Partners: Female  Other Topics Concern   Not on file  Social History Narrative   Lives with his wife and son. Daughter is  out of the house   His wife is an Charity fundraiser.    Their son has significant anxiety.   He exercises by running 3-5 miles 4x/week.   Vegan   Social Drivers of Corporate investment banker Strain: Low Risk  (07/03/2023)   Overall Financial Resource Strain (CARDIA)    Difficulty of Paying Living Expenses: Not hard at all  Food Insecurity: No Food Insecurity (07/03/2023)   Hunger Vital Sign    Worried About Running Out of Food in the Last Year: Never true    Ran Out of Food in the Last Year: Never true  Transportation Needs: No Transportation Needs (07/03/2023)   PRAPARE - Administrator, Civil Service (Medical): No    Lack of Transportation (Non-Medical): No  Physical Activity: Sufficiently Active (07/03/2023)   Exercise Vital Sign    Days of Exercise per Week: 7 days    Minutes of Exercise per Session: 90 min  Stress: Stress Concern Present (07/03/2023)   [employer] of Occupational  Health - Occupational Stress Questionnaire    Feeling of Stress : To some extent  Social Connections: Moderately Isolated (07/03/2023)   Social Connection and Isolation Panel    Frequency of Communication with Friends and Family: More than three times a week    Frequency of Social Gatherings with Friends and Family: Three times a week    Attends Religious Services: Never    Active Member of Clubs or Organizations: No    Attends Banker Meetings: Not on file    Marital Status: Married  Catering manager Violence: Not At Risk (09/06/2018)   Humiliation, Afraid, Rape, and Kick questionnaire    Fear of Current or Ex-Partner: No    Emotionally Abused: No    Physically Abused: No    Sexually Abused: No    Outpatient Medications Prior to Visit  Medication Sig Dispense Refill   amLODipine  (NORVASC ) 10 MG tablet Take 1 tablet (10 mg total) by mouth daily. 30 tablet 0   chlorhexidine  (PERIOGARD ) 0.12 % solution RINSE MOUTH WITH (1 CAPFUL) FOR 30 SECONDS  IN THE MORNING AND EVENING AFTER TOOTHBRUSHING. SPIT OUT, DO NOT SWALLOW. 473 mL 0   COVID-19 mRNA vaccine, Pfizer, (COMIRNATY ) syringe Inject into the muscle. 0.3 mL 0   ezetimibe  (ZETIA ) 10 MG tablet Take 1 tablet (10 mg total) by mouth daily. 30 tablet 0   hydrocortisone cream 1 % Apply 1 application topically daily as needed (poison ivy).     loratadine (CLARITIN) 10 MG tablet Take 10 mg by mouth daily as needed for allergies.     PREVIDENT 0.2 % SOLN Take by mouth as directed.     rosuvastatin  (CRESTOR ) 40 MG tablet Take 1 tablet (40 mg total) by mouth daily. 30 tablet 0   sertraline  (ZOLOFT ) 50 MG tablet Take 1 tablet (50 mg total) by mouth daily. Need appointment for further refills. 30 tablet 0   triamcinolone  cream (KENALOG ) 0.1 % Apply topically daily. 80 g 1   No facility-administered medications prior to visit.    Allergies  Allergen Reactions   Rose Hips [Ascorbate] Itching and Rash   Tequin Rash    ROS     See HPI Objective:    Physical Exam Vitals reviewed.  Constitutional:      General: He is not in acute distress.    Appearance: Normal appearance. He is not ill-appearing.  HENT:     Head: Normocephalic and atraumatic.     Right  Ear: Tympanic membrane normal.     Left Ear: Tympanic membrane normal.     Nose: Nose normal.     Mouth/Throat:     Mouth: Mucous membranes are moist.     Pharynx: Oropharynx is clear.  Eyes:     Extraocular Movements: Extraocular movements intact.     Conjunctiva/sclera: Conjunctivae normal.     Pupils: Pupils are equal, round, and reactive to light.  Neck:     Thyroid : No thyroid  mass or thyromegaly.     Vascular: No carotid bruit.  Cardiovascular:     Rate and Rhythm: Normal rate and regular rhythm.     Pulses: Normal pulses.     Heart sounds: Normal heart sounds.  Pulmonary:     Effort: Pulmonary effort is normal.     Breath sounds: Normal breath sounds.  Abdominal:     General: Abdomen is flat. Bowel sounds are normal. There is no distension.     Palpations: Abdomen is soft. There is no mass.     Tenderness: There is no abdominal tenderness. There is no right CVA tenderness, left CVA tenderness, guarding or rebound.  Genitourinary:    Comments: Deferred exam Musculoskeletal:        General: Normal range of motion.     Cervical back: Normal range of motion and neck supple. No tenderness.     Right lower leg: No edema.     Left lower leg: No edema.  Lymphadenopathy:     Cervical: No cervical adenopathy.  Skin:    General: Skin is warm and dry.     Capillary Refill: Capillary refill takes less than 2 seconds.  Neurological:     General: No focal deficit present.     Mental Status: He is alert and oriented to person, place, and time. Mental status is at baseline.  Psychiatric:        Mood and Affect: Mood normal.        Behavior: Behavior normal.        Thought Content: Thought content normal.        Judgment: Judgment normal.         There were no vitals taken for this visit. Wt Readings from Last 3 Encounters:  07/03/23 187 lb 12.8 oz (85.2 kg)  10/06/22 187 lb (84.8 kg)  05/13/22 182 lb (82.6 kg)       Assessment & Plan:   Problem List Items Addressed This Visit     Annual visit for general adult medical examination without abnormal findings   Patient encouraged to maintain heart healthy diet, regular exercise, adequate sleep. Consider daily probiotics. Take medications as prescribed Labs ordered and reviewed.        Generalized anxiety disorder - Primary (Chronic)   Continue current management strategies and sertraline .         History of colon polyps   Last anoscopy 2022.  Recommendation per GI repeat 3 to 5 years.       Hypertension   Stable on amlodipine  10 mg.Well controlled, no changes to meds. Encouraged heart healthy diet such as the DASH diet and exercise as tolerated.        Lipid disorder   Stable on ezetimibe  and rosuvastatin . Encourage heart healthy diet such as MIND or DASH diet, increase exercise, avoid trans fats, simple carbohydrates and processed foods, consider a krill or fish or flaxseed oil cap daily.        Other Visit Diagnoses       Hyperglycemia  I am having Timothy Hoffman maintain his loratadine, hydrocortisone cream, PreviDent, triamcinolone  cream, chlorhexidine , amLODipine , sertraline , COVID-19 mRNA vaccine (Pfizer), ezetimibe , and rosuvastatin .  No orders of the defined types were placed in this encounter.

## 2024-05-12 ENCOUNTER — Ambulatory Visit (INDEPENDENT_AMBULATORY_CARE_PROVIDER_SITE_OTHER): Admitting: Student

## 2024-05-12 ENCOUNTER — Encounter: Payer: Self-pay | Admitting: Student

## 2024-05-12 ENCOUNTER — Other Ambulatory Visit (HOSPITAL_COMMUNITY): Payer: Self-pay

## 2024-05-12 VITALS — BP 130/88 | HR 88 | Temp 98.2°F | Resp 12 | Ht 70.0 in | Wt 184.6 lb

## 2024-05-12 DIAGNOSIS — I10 Essential (primary) hypertension: Secondary | ICD-10-CM

## 2024-05-12 DIAGNOSIS — Z860100 Personal history of colon polyps, unspecified: Secondary | ICD-10-CM

## 2024-05-12 DIAGNOSIS — E789 Disorder of lipoprotein metabolism, unspecified: Secondary | ICD-10-CM | POA: Diagnosis not present

## 2024-05-12 DIAGNOSIS — R739 Hyperglycemia, unspecified: Secondary | ICD-10-CM

## 2024-05-12 DIAGNOSIS — H6123 Impacted cerumen, bilateral: Secondary | ICD-10-CM

## 2024-05-12 DIAGNOSIS — F411 Generalized anxiety disorder: Secondary | ICD-10-CM

## 2024-05-12 DIAGNOSIS — Z0000 Encounter for general adult medical examination without abnormal findings: Secondary | ICD-10-CM | POA: Diagnosis not present

## 2024-05-12 DIAGNOSIS — L309 Dermatitis, unspecified: Secondary | ICD-10-CM | POA: Diagnosis not present

## 2024-05-12 LAB — CBC WITH DIFFERENTIAL/PLATELET
Basophils Absolute: 0 K/uL (ref 0.0–0.1)
Basophils Relative: 0.7 % (ref 0.0–3.0)
Eosinophils Absolute: 0 K/uL (ref 0.0–0.7)
Eosinophils Relative: 0.7 % (ref 0.0–5.0)
HCT: 44.1 % (ref 39.0–52.0)
Hemoglobin: 15.1 g/dL (ref 13.0–17.0)
Lymphocytes Relative: 16.6 % (ref 12.0–46.0)
Lymphs Abs: 0.8 K/uL (ref 0.7–4.0)
MCHC: 34.3 g/dL (ref 30.0–36.0)
MCV: 91.2 fl (ref 78.0–100.0)
Monocytes Absolute: 0.6 K/uL (ref 0.1–1.0)
Monocytes Relative: 12.4 % — ABNORMAL HIGH (ref 3.0–12.0)
Neutro Abs: 3.3 K/uL (ref 1.4–7.7)
Neutrophils Relative %: 69.6 % (ref 43.0–77.0)
Platelets: 277 K/uL (ref 150.0–400.0)
RBC: 4.83 Mil/uL (ref 4.22–5.81)
RDW: 13.4 % (ref 11.5–15.5)
WBC: 4.7 K/uL (ref 4.0–10.5)

## 2024-05-12 LAB — COMPREHENSIVE METABOLIC PANEL WITH GFR
ALT: 25 U/L (ref 0–53)
AST: 27 U/L (ref 0–37)
Albumin: 4.8 g/dL (ref 3.5–5.2)
Alkaline Phosphatase: 74 U/L (ref 39–117)
BUN: 12 mg/dL (ref 6–23)
CO2: 28 meq/L (ref 19–32)
Calcium: 9.8 mg/dL (ref 8.4–10.5)
Chloride: 103 meq/L (ref 96–112)
Creatinine, Ser: 0.79 mg/dL (ref 0.40–1.50)
GFR: 93.86 mL/min (ref >60.00)
Glucose, Bld: 115 mg/dL — ABNORMAL HIGH (ref 70–99)
Potassium: 4.3 meq/L (ref 3.5–5.1)
Sodium: 139 meq/L (ref 135–145)
Total Bilirubin: 0.9 mg/dL (ref 0.2–1.2)
Total Protein: 7.6 g/dL (ref 6.0–8.3)

## 2024-05-12 LAB — HEMOGLOBIN A1C: Hgb A1c MFr Bld: 6.1 % (ref 4.6–6.5)

## 2024-05-12 LAB — TSH: TSH: 2.24 u[IU]/mL (ref 0.35–5.50)

## 2024-05-12 LAB — LIPID PANEL
Cholesterol: 257 mg/dL — ABNORMAL HIGH (ref 0–200)
HDL: 72 mg/dL (ref >39.00)
LDL Cholesterol: 137 mg/dL — ABNORMAL HIGH (ref 0–99)
NonHDL: 185.36
Total CHOL/HDL Ratio: 4
Triglycerides: 241 mg/dL — ABNORMAL HIGH (ref 0.0–149.0)
VLDL: 48.2 mg/dL — ABNORMAL HIGH (ref 0.0–40.0)

## 2024-05-12 LAB — PSA: PSA: 2.02 ng/mL (ref 0.10–4.00)

## 2024-05-12 MED ORDER — AMLODIPINE BESYLATE 10 MG PO TABS
10.0000 mg | ORAL_TABLET | Freq: Every day | ORAL | 2 refills | Status: AC
  Filled 2024-05-12: qty 90, 90d supply, fill #0
  Filled 2024-08-11: qty 90, 90d supply, fill #1
  Filled 2024-11-21: qty 90, 90d supply, fill #2

## 2024-05-12 MED ORDER — SERTRALINE HCL 50 MG PO TABS
50.0000 mg | ORAL_TABLET | Freq: Every day | ORAL | 1 refills | Status: DC
  Filled 2024-05-12: qty 90, 90d supply, fill #0
  Filled 2024-08-11: qty 90, 90d supply, fill #1

## 2024-05-12 NOTE — Assessment & Plan Note (Signed)
   Seasonal rash, responsive to triamcinolone. -Prescribe triamcinolone for flare-ups. -Consider alternative treatment (lotrisone) if triamcinolone does not clear rash.

## 2024-05-12 NOTE — Assessment & Plan Note (Signed)
 Advised Debrox OTC. Consider ear irrigation in clinic if Debrox in effective.

## 2024-05-13 ENCOUNTER — Ambulatory Visit: Payer: Self-pay | Admitting: Student

## 2024-05-13 DIAGNOSIS — E789 Disorder of lipoprotein metabolism, unspecified: Secondary | ICD-10-CM

## 2024-06-01 ENCOUNTER — Other Ambulatory Visit (HOSPITAL_COMMUNITY): Payer: Self-pay

## 2024-06-01 ENCOUNTER — Other Ambulatory Visit: Payer: Self-pay | Admitting: Family Medicine

## 2024-06-01 DIAGNOSIS — E789 Disorder of lipoprotein metabolism, unspecified: Secondary | ICD-10-CM

## 2024-06-01 MED ORDER — EZETIMIBE 10 MG PO TABS
10.0000 mg | ORAL_TABLET | Freq: Every day | ORAL | 0 refills | Status: DC
  Filled 2024-06-01 – 2024-06-07 (×2): qty 30, 30d supply, fill #0

## 2024-06-01 MED ORDER — ROSUVASTATIN CALCIUM 40 MG PO TABS
40.0000 mg | ORAL_TABLET | Freq: Every day | ORAL | 0 refills | Status: DC
  Filled 2024-06-01 – 2024-06-07 (×2): qty 30, 30d supply, fill #0

## 2024-06-07 ENCOUNTER — Other Ambulatory Visit (HOSPITAL_COMMUNITY): Payer: Self-pay

## 2024-06-27 ENCOUNTER — Other Ambulatory Visit (HOSPITAL_COMMUNITY): Payer: Self-pay

## 2024-06-27 ENCOUNTER — Encounter: Payer: Self-pay | Admitting: Cardiology

## 2024-06-27 ENCOUNTER — Ambulatory Visit: Attending: Cardiology | Admitting: Cardiology

## 2024-06-27 VITALS — BP 118/64 | HR 82 | Ht 69.5 in | Wt 184.8 lb

## 2024-06-27 DIAGNOSIS — I2510 Atherosclerotic heart disease of native coronary artery without angina pectoris: Secondary | ICD-10-CM | POA: Diagnosis not present

## 2024-06-27 DIAGNOSIS — E782 Mixed hyperlipidemia: Secondary | ICD-10-CM | POA: Insufficient documentation

## 2024-06-27 DIAGNOSIS — I10 Essential (primary) hypertension: Secondary | ICD-10-CM

## 2024-06-27 MED ORDER — ASPIRIN 81 MG PO TBEC
81.0000 mg | DELAYED_RELEASE_TABLET | Freq: Every day | ORAL | 3 refills | Status: AC
  Filled 2024-06-27: qty 90, 90d supply, fill #0
  Filled 2024-11-04: qty 90, 90d supply, fill #1

## 2024-06-27 NOTE — Patient Instructions (Addendum)
 Medication Instructions:  START Aspirin  81 mg daily   *If you need a refill on your cardiac medications before your next appointment, please call your pharmacy*  Testing/Procedures: Exercise nuclear stress test   Your physician has requested that you have en exercise stress myoview. For further information please visit [email or web address] Please follow instruction sheet, as given.   REFERRAL TO PHARM-D   Follow-Up: At [facility], you and your health needs are our priority.  As part of our continuing mission to provide you with exceptional heart care, our providers are all part of one team.  This team includes your primary Cardiologist (physician) and Advanced Practice Providers or APPs (Physician Assistants and Nurse Practitioners) who all work together to provide you with the care you need, when you need it.  Your next appointment:   1 year(s)  Provider:   Newman JINNY Lawrence, MD

## 2024-06-27 NOTE — Progress Notes (Signed)
 Cardiology Office Note:  .   Date:  06/27/2024  ID:  Timothy Hoffman, DOB 11-10-1960, MRN [id number] PCP: Domenica Harlene LABOR, MD  [facility] Providers Cardiologist:  Newman Lawrence, MD PCP: Domenica Harlene LABOR, MD  Chief Complaint  Patient presents with   Hypertension   Coronary Artery Disease     Timothy Hoffman is a 63 y.o. male with hypertension, hyperlipidemia, elevated CAC, CAD  Discussed the use of AI scribe software for clinical note transcription with the patient, who gave verbal consent to proceed.  History of Present Illness Timothy Hoffman is a 62 year old male who presents for evaluation of a high calcium  score. Referred by his primary care doctor for a high calcium  score.  He has a calcium  score of 700. He experiences no chest pain or shortness of breath during his daily six-mile walks.  He has hyperlipidemia, first identified approximately 40 years ago with a cholesterol level of 300. He is currently on Crestor  40 mg and Zetia , with a cholesterol level of 240.  His family history includes hyperlipidemia on his mother's side, affecting his mother and oldest brother, but no heart disease. His mother is 67 years old, and his father died from non-Hodgkin's lymphoma. His grandfathers lived into their 48s, and his grandmother had a stroke in her 47s.  He follows a mostly vegetarian diet, influenced by his wife who is more vegan, but consumes a significant amount of cheese. He has never smoked.      Vitals:   06/27/24 1007  BP: 118/64  Pulse: 82  SpO2: 95%      Review of Systems  Cardiovascular:  Negative for chest pain, dyspnea on exertion, leg swelling, palpitations and syncope.        Studies Reviewed: SABRA        EKG 06/27/2024: Normal sinus rhythm Normal ECG When compared with ECG of 17-Feb-2011 08:35, No significant change was found  CT cardiac scoring 03/2024: Left main: 93.1 Left anterior descending artery: 323 Left circumflex artery: 0 Right  coronary artery: 330 Total: 747 Percentile: 90  Labs 05/2024: Chol 267, TG 241, HDL 72, LDL 137 HbA1C 6.1% Hb 15.1 Cr 0.79 TSH 2.2   Physical Exam Vitals and nursing note reviewed.  Constitutional:      General: He is not in acute distress. Neck:     Vascular: No JVD.  Cardiovascular:     Rate and Rhythm: Normal rate and regular rhythm.     Heart sounds: Normal heart sounds. No murmur heard. Pulmonary:     Effort: Pulmonary effort is normal.     Breath sounds: Normal breath sounds. No wheezing or rales.  Musculoskeletal:     Right lower leg: No edema.     Left lower leg: No edema.      VISIT DIAGNOSES:   ICD-10-CM   1. Primary hypertension  I10 EKG 12-Lead    2. Coronary artery disease involving native coronary artery of native heart without angina pectoris  I25.10 EKG 12-Lead    MYOCARDIAL PERFUSION IMAGING    Cardiac Stress Test: Informed Consent Details: Physician/Practitioner Attestation; Transcribe to consent form and obtain patient signature    3. Mixed hyperlipidemia  E78.2 AMB Referral to [facility] Pharm-[facility]       Timothy Hoffman is a 63 y.o. male with hypertension, hyperlipidemia, elevated CAC, CAD  Assessment & Plan Mixed hyperlipidemia: Strongly suspect familial hypercholesterolemia. LDL 137 on Crestor  40 mg daily, Zetia  10 mg daily. Severe multivessel calcification.  Recommend exercise nuclear stress test to evaluate for ischemia. In addition, referral to lipid clinic for consideration of PCSK9 elevators.  Patient prefers Leqvio or Repatha for frequency of injections. In absence of bleeding, recommend aspirin  81 mg daily given left main and multivessel calcification.     Informed Consent   Shared Decision Making/Informed Consent The risks [chest pain, shortness of breath, cardiac arrhythmias, dizziness, blood pressure fluctuations, myocardial infarction, stroke/transient ischemic attack, nausea, vomiting, allergic reaction, radiation exposure,  metallic taste sensation and life-threatening complications (estimated to be 1 in 10,000)], benefits (risk stratification, diagnosing coronary artery disease, treatment guidance) and alternatives of a nuclear stress test were discussed in detail with Mr. Chalfin and he agrees to proceed.       Meds ordered this encounter  Medications   aspirin  EC 81 MG tablet    Sig: Take 1 tablet (81 mg total) by mouth daily. Swallow whole.    Dispense:  90 tablet    Refill:  3     F/u in 1 year, unless any significant abnormalities found on stress testing.  Signed, Newman JINNY Lawrence, MD

## 2024-07-06 ENCOUNTER — Other Ambulatory Visit: Payer: Self-pay | Admitting: Cardiology

## 2024-07-06 DIAGNOSIS — I2510 Atherosclerotic heart disease of native coronary artery without angina pectoris: Secondary | ICD-10-CM

## 2024-07-08 ENCOUNTER — Ambulatory Visit (HOSPITAL_COMMUNITY)
Admission: RE | Admit: 2024-07-08 | Discharge: 2024-07-08 | Disposition: A | Discharge status: Home / Self Care | Source: Ambulatory Visit | Attending: Cardiology | Admitting: Cardiology

## 2024-07-08 ENCOUNTER — Ambulatory Visit: Payer: Self-pay | Admitting: Cardiology

## 2024-07-08 ENCOUNTER — Other Ambulatory Visit: Payer: Self-pay | Admitting: Family Medicine

## 2024-07-08 ENCOUNTER — Other Ambulatory Visit (HOSPITAL_COMMUNITY): Payer: Self-pay

## 2024-07-08 DIAGNOSIS — I2510 Atherosclerotic heart disease of native coronary artery without angina pectoris: Secondary | ICD-10-CM | POA: Insufficient documentation

## 2024-07-08 DIAGNOSIS — E789 Disorder of lipoprotein metabolism, unspecified: Secondary | ICD-10-CM

## 2024-07-08 LAB — MYOCARDIAL PERFUSION IMAGING
Angina Index: 0
Base ST Depression (mm): 0 mm
Duke Treadmill Score: 9
Estimated workload: 10.1
Exercise duration (min): 9 min
Exercise duration (sec): 0 s
LV dias vol: 79 mL (ref 62–150)
LV sys vol: 14 mL (ref 4.2–5.8)
MPHR: 156 {beats}/min
Nuc Stress EF: 82 %
Peak HR: 155 {beats}/min
Percent HR: 99 %
Rest HR: 100 {beats}/min
Rest Nuclear Isotope Dose: 10.2 mCi
SDS: 0
SRS: 3
SSS: 0
ST Depression (mm): 0 mm
Stress Nuclear Isotope Dose: 31.7 mCi
TID: 1.07

## 2024-07-08 MED ORDER — TECHNETIUM TC 99M TETROFOSMIN IV KIT
10.3000 | PACK | Freq: Once | INTRAVENOUS | Status: AC | PRN
  Administered 2024-07-08: 10.3 via INTRAVENOUS

## 2024-07-08 MED ORDER — TECHNETIUM TC 99M TETROFOSMIN IV KIT
31.7000 | PACK | Freq: Once | INTRAVENOUS | Status: AC | PRN
  Administered 2024-07-08: 31.7 via INTRAVENOUS

## 2024-07-08 MED ORDER — ROSUVASTATIN CALCIUM 40 MG PO TABS
40.0000 mg | ORAL_TABLET | Freq: Every day | ORAL | 1 refills | Status: AC
  Filled 2024-07-08: qty 90, 90d supply, fill #0
  Filled 2024-10-10: qty 90, 90d supply, fill #1

## 2024-07-08 MED ORDER — EZETIMIBE 10 MG PO TABS
10.0000 mg | ORAL_TABLET | Freq: Every day | ORAL | 1 refills | Status: AC
  Filled 2024-07-08: qty 90, 90d supply, fill #0
  Filled 2024-10-10: qty 90, 90d supply, fill #1

## 2024-07-21 ENCOUNTER — Ambulatory Visit: Payer: Self-pay | Admitting: Cardiology

## 2024-07-29 ENCOUNTER — Ambulatory Visit: Attending: Cardiovascular Disease | Admitting: Pharmacist

## 2024-07-29 ENCOUNTER — Encounter: Payer: Self-pay | Admitting: Family

## 2024-07-29 ENCOUNTER — Encounter: Payer: Self-pay | Admitting: Internal Medicine

## 2024-07-29 ENCOUNTER — Other Ambulatory Visit (HOSPITAL_COMMUNITY): Payer: Self-pay

## 2024-07-29 DIAGNOSIS — E782 Mixed hyperlipidemia: Secondary | ICD-10-CM

## 2024-07-29 DIAGNOSIS — E789 Disorder of lipoprotein metabolism, unspecified: Secondary | ICD-10-CM

## 2024-07-29 MED ORDER — REPATHA SURECLICK 140 MG/ML ~~LOC~~ SOAJ
1.0000 mL | SUBCUTANEOUS | 3 refills | Status: AC
  Filled 2024-07-29: qty 6, 84d supply, fill #0
  Filled 2024-10-10: qty 6, 84d supply, fill #1

## 2024-07-29 NOTE — Assessment & Plan Note (Signed)
 Assessment: LDL-C elevated on rosuvastatin  40 mg daily Ezetimibe  was added about a month ago but will not get patient to goal  Discussed Repatha  versus Leqvio He is very active walks about 6 miles per day and does weight training a few days a week Mainly a vegetarian with some fish Does drink about 3 beers per day-triglycerides historically elevated, encouraged him to cut back to no more than 2 and not every day  Plan: Repatha  should not need PA with [facility] and a plan Is able to get patient a co-pay card and information was given to [facility] Recheck labs in 3 months

## 2024-07-29 NOTE — Progress Notes (Signed)
 Patient ID: JACORION KLEM                 DOB: 15-Aug-1961                    MRN: [id number]      HPI: Timothy Hoffman is a 63 y.o. male patient referred to lipid clinic by Dr. Elmira. PMH is significant for  hypertension, hyperlipidemia, elevated CAC, CAD.  Patient presents today to discuss additional cholesterol-lowering medications.  Patient with suspected familial hyperlipidemia.  His LDL-C was 137 on rosuvastatin  40 mg daily.  Ezetimibe  added at the beginning of the month.  He had a coronary calcium  score done which resulted in a score of 747 which was 90th percentile for age and race and sex matched controls.  Stress test normal.  Currently he is on [employer].  Thinks he has about $600 left of his deductible.  Next year he will be on Medicare but unsure what plan he is going to go with.  Patient is a [employer], broadcasting/film/video in [employer], wife is Publishing copy of [employer].  Reviewed options for lowering LDL cholesterol, including PCSK-9 inhibitors and inclisiran.  Discussed mechanisms of action, dosing, side effects and potential decreases in LDL cholesterol.  Also reviewed cost information and potential options for cost assistance.   Current Medications: rosuvastatin  40mg , ezetimibe  10mg  daily Intolerances: None Risk Factors: Coronary calcium  score above 400, familial hyperlipidemia LDL-C goal: Less than 70 but certainly could consider less than 55 ApoB goal: Less than 80 but certainly could consider as far as less than 70  Diet: plant based diet, meat rarely - some fish Fruits, vegetables, beans, tofu, tuna, nut butters  Exercise: walks 6 miles per day, weight training a couple days a week  Family History:  Family History  Problem Relation Age of Onset   Lymphoma Father    Cancer Father        nonHodgkin's lymphoma   Hyperlipidemia Mother    Hypertension Mother    Hypertension Brother    Hyperlipidemia Brother    Mental illness Son        Anxiety   Anxiety  disorder Son    ADD / ADHD Son    Stroke Maternal Grandmother    Colon cancer Neg Hx    Colon polyps Neg Hx    Esophageal cancer Neg Hx    Rectal cancer Neg Hx    Stomach cancer Neg Hx     Social History: 3 beers per day, no tobacco, no illicit drugs  Labs: Lipid Panel     Component Value Date/Time   CHOL 257 (H) 05/12/2024 1142   CHOL 212 (H) 07/24/2017 1554   TRIG 241.0 (H) 05/12/2024 1142   HDL 72.00 05/12/2024 1142   HDL 66 07/24/2017 1554   CHOLHDL 4 05/12/2024 1142   VLDL 48.2 (H) 05/12/2024 1142   LDLCALC 137 (H) 05/12/2024 1142   LDLCALC 105 (H) 07/24/2017 1554   LDLDIRECT 223.0 07/03/2023 1029   LABVLDL 41 (H) 07/24/2017 1554    Past Medical History:  Diagnosis Date   Allergy    SEASONAL   Anemia    IV iron x3 05/2019   Anxiety    Arrhythmia 1999   Atopic dermatitis    Diverticulosis    History of colon polyps    Hyperlipemia    Internal hemorrhoid, bleeding 2013   PONV (postoperative nausea and vomiting)     Current Outpatient Medications on  File Prior to Visit  Medication Sig Dispense Refill   amLODipine  (NORVASC ) 10 MG tablet Take 1 tablet (10 mg total) by mouth daily. 90 tablet 2   aspirin  EC 81 MG tablet Take 1 tablet (81 mg total) by mouth daily. Swallow whole. 90 tablet 3   ezetimibe  (ZETIA ) 10 MG tablet Take 1 tablet (10 mg total) by mouth daily. 90 tablet 1   hydrocortisone cream 1 % Apply 1 application topically daily as needed (poison ivy).     loratadine (CLARITIN) 10 MG tablet Take 10 mg by mouth daily as needed for allergies.     rosuvastatin  (CRESTOR ) 40 MG tablet Take 1 tablet (40 mg total) by mouth daily. 90 tablet 1   sertraline  (ZOLOFT ) 50 MG tablet Take 1 tablet (50 mg total) by mouth daily. Need appointment for further refills. 90 tablet 1   triamcinolone  cream (KENALOG ) 0.1 % Apply topically daily. 80 g 1   No current facility-administered medications on file prior to visit.    Allergies  Allergen Reactions   Rose Hips  [Ascorbate] Itching and Rash   Tequin Rash    Assessment/Plan:  1. Hyperlipidemia -  Mixed hyperlipidemia Assessment: LDL-C elevated on rosuvastatin  40 mg daily Ezetimibe  was added about a month ago but will not get patient to goal  Discussed Repatha  versus Leqvio He is very active walks about 6 miles per day and does weight training a few days a week Mainly a vegetarian with some fish Does drink about 3 beers per day-triglycerides historically elevated, encouraged him to cut back to no more than 2 and not every day  Plan: Repatha  should not need PA with [facility] and a plan Is able to get patient a co-pay card and information was given to [facility] pharmacy Recheck labs in 3 months    Thank you,  Symia Herdt D Saretta Dahlem, Pharm.JONETTA SARAN, CPP [facility] [address], [place], [place] [address]  Phone: [phone]; Fax: [phone]

## 2024-07-29 NOTE — Patient Instructions (Addendum)
 Please call me at [phone] with any questions. Continue rosuvastatin  40mg  and Zetia  10mg  daily Start Repatha  every 14 days   Repatha  is a cholesterol medication that improved your body's ability to get rid of bad cholesterol known as LDL. It can lower your LDL up to 60%! It is an injection that is given under the skin every 2 weeks. The medication often requires a prior authorization from your insurance company. We will take care of submitting all the necessary information to your insurance company to get it approved. The most common side effects of Repatha  include runny nose, symptoms of the common cold, rarely flu or flu-like symptoms, back/muscle pain in about 3-4% of the patients, and redness, pain, or bruising at the injection site. Tell your healthcare provider if you have any side effect that bothers you or that does not go away.   RxBin: [id number] RxPCN: [id number] RxGrp: [id number] ID: [id number]

## 2024-10-17 ENCOUNTER — Other Ambulatory Visit (HOSPITAL_COMMUNITY): Payer: Self-pay

## 2024-10-17 MED ORDER — ZOSTER VAC RECOMB ADJUVANTED 50 MCG/0.5ML IM SUSR
0.5000 mL | Freq: Once | INTRAMUSCULAR | 0 refills | Status: AC
  Filled 2024-10-17: qty 0.5, 1d supply, fill #0

## 2024-10-28 ENCOUNTER — Encounter: Payer: Self-pay | Admitting: Pharmacist

## 2024-10-31 ENCOUNTER — Other Ambulatory Visit (HOSPITAL_COMMUNITY): Payer: Self-pay

## 2024-10-31 MED ORDER — COMIRNATY 30 MCG/0.3ML IM SUSY
0.3000 mL | PREFILLED_SYRINGE | Freq: Once | INTRAMUSCULAR | 0 refills | Status: AC
  Filled 2024-10-31: qty 0.3, 1d supply, fill #0

## 2024-10-31 MED ORDER — FLUZONE 0.5 ML IM SUSY
0.5000 mL | PREFILLED_SYRINGE | Freq: Once | INTRAMUSCULAR | 0 refills | Status: AC
  Filled 2024-10-31: qty 0.5, 1d supply, fill #0

## 2024-11-04 ENCOUNTER — Other Ambulatory Visit: Payer: Self-pay

## 2024-11-04 ENCOUNTER — Other Ambulatory Visit (HOSPITAL_COMMUNITY): Payer: Self-pay

## 2024-11-04 ENCOUNTER — Other Ambulatory Visit: Payer: Self-pay | Admitting: Student

## 2024-11-04 ENCOUNTER — Encounter: Payer: Self-pay | Admitting: Family

## 2024-11-04 DIAGNOSIS — F411 Generalized anxiety disorder: Secondary | ICD-10-CM

## 2024-11-04 MED ORDER — SERTRALINE HCL 50 MG PO TABS
50.0000 mg | ORAL_TABLET | Freq: Every day | ORAL | 1 refills | Status: AC
  Filled 2024-11-04: qty 90, 90d supply, fill #0

## 2024-12-19 ENCOUNTER — Encounter: Admitting: Family Medicine

## 2025-05-19 ENCOUNTER — Encounter: Admitting: Student
